# Patient Record
Sex: Male | Born: 1975 | Race: Black or African American | Hispanic: No | Marital: Married | Smoking: Never smoker
Health system: Southern US, Community
[De-identification: ages and names within clinical notes are randomized; demographics above are authoritative.]

## PROBLEM LIST (undated history)

## (undated) DIAGNOSIS — J45909 Unspecified asthma, uncomplicated: Secondary | ICD-10-CM

## (undated) DIAGNOSIS — E669 Obesity, unspecified: Secondary | ICD-10-CM

## (undated) DIAGNOSIS — I10 Essential (primary) hypertension: Secondary | ICD-10-CM

## (undated) HISTORY — PX: TENDON REPAIR: SHX5111

## (undated) HISTORY — DX: Unspecified asthma, uncomplicated: J45.909

## (undated) HISTORY — DX: Essential (primary) hypertension: I10

## (undated) HISTORY — DX: Obesity, unspecified: E66.9

---

## 2013-10-31 ENCOUNTER — Emergency Department: Payer: Self-pay | Admitting: Emergency Medicine

## 2015-01-14 ENCOUNTER — Ambulatory Visit: Payer: Self-pay | Admitting: Family Medicine

## 2015-01-15 ENCOUNTER — Ambulatory Visit: Payer: Self-pay | Admitting: Family Medicine

## 2015-02-04 ENCOUNTER — Ambulatory Visit: Payer: Self-pay | Admitting: Family Medicine

## 2015-02-24 ENCOUNTER — Ambulatory Visit (INDEPENDENT_AMBULATORY_CARE_PROVIDER_SITE_OTHER): Payer: BC Managed Care – PPO | Admitting: Family Medicine

## 2015-02-24 ENCOUNTER — Encounter: Payer: Self-pay | Admitting: Family Medicine

## 2015-02-24 VITALS — BP 127/86 | HR 76 | Temp 97.0°F | Ht 72.0 in | Wt 281.0 lb

## 2015-02-24 DIAGNOSIS — E669 Obesity, unspecified: Secondary | ICD-10-CM

## 2015-02-24 DIAGNOSIS — I83893 Varicose veins of bilateral lower extremities with other complications: Secondary | ICD-10-CM | POA: Diagnosis not present

## 2015-02-24 DIAGNOSIS — I10 Essential (primary) hypertension: Secondary | ICD-10-CM | POA: Diagnosis not present
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
## 2015-02-24 NOTE — Patient Instructions (Addendum)
Try horse chestnut for vascular health We'll refer to vascular specialist Try to use PLAIN allergy medicine without the decongestant Avoid: phenylephrine, phenylpropanolamine, and pseudoephredine If you need something for aches or pains, try to use Tylenol (acetaminphen) instead of non-steroidals (which include Aleve, ibuprofen, Advil, Motrin, and naproxen); non-steroidals can cause long-term kidney damage Your goal blood pressure is less than 140 mmHg on top, bottom number less than 90. Try to follow the DASH guidelines (DASH stands for Dietary Approaches to Stop Hypertension) Try to limit the sodium in your diet.  Ideally, consume less than 1.5 grams (less than 1,500mg ) per day. Do not add salt when cooking or at the table.  Check the sodium amount on labels when shopping, and choose items lower in sodium when given a choice. Avoid or limit foods that already contain a lot of sodium. Eat a diet rich in fruits and vegetables and whole grains. I do recommend yearly flu shots; for individuals who don't want flu shots, try to practice excellent hand hygiene, and avoid nursing homes, day cares, and hospitals during peak flu season; taking additional vitamin C daily during flu/cold season may help boost your immune system too

## 2015-03-02 DIAGNOSIS — I10 Essential (primary) hypertension: Secondary | ICD-10-CM | POA: Insufficient documentation

## 2015-03-02 DIAGNOSIS — E669 Obesity, unspecified: Secondary | ICD-10-CM | POA: Insufficient documentation

## 2015-03-02 DIAGNOSIS — Z6841 Body Mass Index (BMI) 40.0 and over, adult: Secondary | ICD-10-CM | POA: Insufficient documentation

## 2015-03-02 HISTORY — DX: Obesity, unspecified: E66.9

## 2015-03-02 HISTORY — DX: Essential (primary) hypertension: I10

## 2015-03-02 NOTE — Assessment & Plan Note (Signed)
Encouragement given to lose weight 

## 2015-03-02 NOTE — Assessment & Plan Note (Signed)
Refer to vascular specialist; consider horse chestnut which may have some benefit; weight loss encouraged

## 2015-03-02 NOTE — Assessment & Plan Note (Signed)
Would like to see most recent creatinine and electrolytes; continue DASH guidelines; work on weight loss; patient to monitor BP and let me know if not at goal; continue meds

## 2015-05-14 ENCOUNTER — Encounter: Payer: Self-pay | Admitting: Family Medicine

## 2015-05-14 ENCOUNTER — Ambulatory Visit (INDEPENDENT_AMBULATORY_CARE_PROVIDER_SITE_OTHER): Payer: BC Managed Care – PPO | Admitting: Family Medicine

## 2015-05-14 VITALS — BP 119/83 | HR 101 | Temp 98.3°F | Ht 72.3 in | Wt 284.0 lb

## 2015-05-14 DIAGNOSIS — J209 Acute bronchitis, unspecified: Secondary | ICD-10-CM | POA: Diagnosis not present

## 2015-05-14 MED ORDER — BENZONATATE 200 MG PO CAPS: 200.0000 mg | ORAL_CAPSULE | Freq: Two times a day (BID) | ORAL | Status: DC | PRN

## 2015-05-14 MED ORDER — AZITHROMYCIN 250 MG PO TABS: ORAL_TABLET | ORAL | Status: DC

## 2015-05-14 MED ORDER — PREDNISONE 10 MG PO TABS: ORAL_TABLET | ORAL | Status: DC

## 2015-05-14 NOTE — Progress Notes (Signed)
BP 119/83 mmHg  Pulse 101  Temp(Src) 98.3 F (36.8 C)  Ht 6' 0.3" (1.836 m)  Wt 284 lb (128.822 kg)  BMI 38.22 kg/m2  SpO2 95%   Subjective:    Patient ID: Jeffrey Cohen, male    DOB: 1975/04/09, 40 y.o.   MRN: [id number]  HPI: Jeffrey Cohen is a 40 y.o. male  Chief Complaint  Patient presents with  . URI    started last Sunday, has continued to work and take OTC meds   UPPER RESPIRATORY TRACT INFECTION Duration: 1 week Worst symptom: coughing and SOB Fever: no Cough: yes Shortness of breath: yes Wheezing: yes Chest pain: yes, with cough Chest tightness: yes Chest congestion: yes Nasal congestion: no Runny nose: yes Post nasal drip: yes Sneezing: no Sore throat: no Swollen glands: no Sinus pressure: no Headache: yes Face pain: no Toothache: no Ear pain: no  Ear pressure: no  Eyes red/itching:no Eye drainage/crusting: no  Vomiting: no Rash: no Fatigue: yes Sick contacts: yes Strep contacts: no  Context: better Recurrent sinusitis: no Relief with OTC cold/cough medications: yes  Treatments attempted: cold/sinus, mucinex, anti-histamine and pseudoephedrine   Relevant past medical, surgical, family and social history reviewed and updated as indicated. Interim medical history since our last visit reviewed. Allergies and medications reviewed and updated.  Review of Systems  Constitutional: Negative.   HENT: Positive for congestion, postnasal drip, rhinorrhea, sneezing and sore throat. Negative for dental problem, drooling, ear discharge, ear pain, facial swelling, hearing loss, mouth sores, nosebleeds, sinus pressure, tinnitus, trouble swallowing and voice change.   Respiratory: Positive for cough, chest tightness, shortness of breath and wheezing. Negative for apnea, choking and stridor.   Cardiovascular: Negative.   Psychiatric/Behavioral: Negative.     Per HPI unless specifically indicated above     Objective:    BP 119/83 mmHg  Pulse 101  Temp(Src)  98.3 F (36.8 C)  Ht 6' 0.3" (1.836 m)  Wt 284 lb (128.822 kg)  BMI 38.22 kg/m2  SpO2 95%  Wt Readings from Last 3 Encounters:  05/14/15 284 lb (128.822 kg)  02/24/15 281 lb (127.461 kg)    Physical Exam  Constitutional: He is oriented to person, place, and time. He appears well-developed and well-nourished. No distress.  HENT:  Head: Normocephalic and atraumatic.  Right Ear: Hearing, tympanic membrane, external ear and ear canal normal.  Left Ear: Hearing, tympanic membrane, external ear and ear canal normal.  Nose: Mucosal edema and rhinorrhea present.  Mouth/Throat: Uvula is midline, oropharynx is clear and moist and mucous membranes are normal. No oropharyngeal exudate.  Eyes: Conjunctivae, EOM and lids are normal. Pupils are equal, round, and reactive to light. Right eye exhibits no discharge. Left eye exhibits no discharge. No scleral icterus.  Neck: Normal range of motion. Neck supple. No JVD present. No tracheal deviation present. No thyromegaly present.  Cardiovascular: Normal rate, regular rhythm, normal heart sounds and intact distal pulses.  Exam reveals no gallop and no friction rub.   No murmur heard. Pulmonary/Chest: Effort normal. No stridor. No respiratory distress. He has decreased breath sounds in the right upper field, the right middle field, the left upper field, the left middle field and the left lower field. He has wheezes in the right upper field, the right middle field, the left upper field and the left middle field. He has no rales. He exhibits no tenderness.  Musculoskeletal: Normal range of motion.  Lymphadenopathy:    He has cervical adenopathy.  Neurological: He is alert  and oriented to person, place, and time.  Skin: Skin is warm, dry and intact. No rash noted. No erythema. No pallor.  Psychiatric: He has a normal mood and affect. His speech is normal and behavior is normal. Judgment and thought content normal. Cognition and memory are normal.  Nursing note  and vitals reviewed.   No results found for this or any previous visit.    Assessment & Plan:   Problem List Items Addressed This Visit    None    Visit Diagnoses    Acute bronchitis, unspecified organism    -  Primary    Will treat with prednisone, azithromycin and tessalon. Continue to mointor. Call with any concerns.         Follow up plan: Return if symptoms worsen or fail to improve.

## 2015-05-17 ENCOUNTER — Telehealth: Payer: Self-pay

## 2015-05-17 ENCOUNTER — Encounter: Payer: Self-pay | Admitting: Family Medicine

## 2015-05-17 NOTE — Telephone Encounter (Signed)
Rx written, OK for them to come pick up.

## 2015-05-17 NOTE — Telephone Encounter (Signed)
Patient wife called, patient is still sick. Is there anyway that he can have a work note for today and tomorrow.

## 2015-05-19 ENCOUNTER — Telehealth: Payer: Self-pay | Admitting: Family Medicine

## 2015-05-19 NOTE — Telephone Encounter (Signed)
I spoke with patient's wife; he is getting better, but still has issues with getting winded; hacking up mucous; did have a fever the first few days on the antibiotics; the breathing is the real issue; he is supposed to go back to work on Saturday; I offered xray, she doesn't think he needs it today; offered appt; she'll bring him tomorrow with her Jeffrey Cohen -- please book patient for Thursday March 2nd at 9:45 Move wife's appt from 9:30 am to 8:30 am

## 2015-05-19 NOTE — Telephone Encounter (Signed)
Routing to provider  

## 2015-05-19 NOTE — Telephone Encounter (Signed)
Pt's wife called stated pt is still not feeling better even after completing antibiotics. Pt is getting winded even just walking up the steps. Can Dr. Sherie Don please call pt's wife she is very concerned about the pt. Please call ASAP. Pt returns to work on Saturday. Thanks.

## 2015-05-20 ENCOUNTER — Encounter: Payer: Self-pay | Admitting: Family Medicine

## 2015-05-20 ENCOUNTER — Ambulatory Visit (INDEPENDENT_AMBULATORY_CARE_PROVIDER_SITE_OTHER): Payer: BC Managed Care – PPO | Admitting: Family Medicine

## 2015-05-20 VITALS — BP 144/90 | HR 72 | Temp 97.8°F | Wt 278.0 lb

## 2015-05-20 DIAGNOSIS — J069 Acute upper respiratory infection, unspecified: Secondary | ICD-10-CM | POA: Diagnosis not present

## 2015-05-20 DIAGNOSIS — R062 Wheezing: Secondary | ICD-10-CM

## 2015-05-20 DIAGNOSIS — B9789 Other viral agents as the cause of diseases classified elsewhere: Principal | ICD-10-CM

## 2015-05-20 DIAGNOSIS — I10 Essential (primary) hypertension: Secondary | ICD-10-CM

## 2015-05-20 MED ORDER — HYDROCOD POLST-CPM POLST ER 10-8 MG/5ML PO SUER: 5.0000 mL | Freq: Two times a day (BID) | ORAL | Status: DC | PRN

## 2015-05-20 MED ORDER — ALBUTEROL SULFATE HFA 108 (90 BASE) MCG/ACT IN AERS: 2.0000 | INHALATION_SPRAY | Freq: Four times a day (QID) | RESPIRATORY_TRACT | Status: DC | PRN

## 2015-05-20 NOTE — Patient Instructions (Addendum)
Work note up front Please do eat yogurt daily or take a probiotic daily for the next month or two We want to replace the healthy germs in the gut If you notice foul, watery diarrhea in the next two months, schedule an appointment RIGHT AWAY Use the inhaler as needed Use the new cough medicine as needed Try vitamin C (orange juice if not diabetic or vitamin C tablets) and drink green tea to help your immune system during your illness Get plenty of rest and hydration Return in several weeks for a pneumonia vaccine (PPSV-23) Keep working on weight loss and DASH guidelines

## 2015-05-20 NOTE — Progress Notes (Signed)
BP 144/90 mmHg  Pulse 72  Temp(Src) 97.8 F (36.6 C)  Wt 278 lb (126.1 kg)  SpO2 97%   Subjective:    Patient ID: Jeffrey Cohen, male    DOB: Aug 13, 1975, 40 y.o.   MRN: [id number]  HPI: Jeffrey Cohen is a 40 y.o. male  Chief Complaint  Patient presents with  . Winded    He is doing betetr but still gets a little winded, hacking up alot of mucous   He was here for a sick visit Fever off and on for three days He had pneumonia as a child and an adult; he just keeps going Winded with exertion; no SHOB at rest Sputum was brown and clear today except little bit of yellow Has had diarrhea for 2-3 days, not eating well Tessalon works okay No issues with prednisone Issues with the antibiotics; no rash; stomach made weird sounds  Patient does not do flu shots  BP away from here, 130s systolic; paitent content with current readings; trying to do DASH guidelines, working on weight loss; does not want more medicine  Relevant past medical history reviewed  Interim medical history since our last visit reviewed. Allergies and medications reviewed and updated.  Review of Systems Per HPI unless specifically indicated above     Objective:    BP 144/90 mmHg  Pulse 72  Temp(Src) 97.8 F (36.6 C)  Wt 278 lb (126.1 kg)  SpO2 97%  Wt Readings from Last 3 Encounters:  05/20/15 278 lb (126.1 kg)  05/14/15 284 lb (128.822 kg)  02/24/15 281 lb (127.461 kg)    Physical Exam  Constitutional: He appears well-developed and well-nourished. No distress.  Weight loss 6 pounds over last week  Eyes: Right eye exhibits no discharge. Left eye exhibits no discharge. No scleral icterus.  Neck: No JVD present.  Cardiovascular: Normal rate and regular rhythm.   Pulmonary/Chest: Effort normal. He has wheezes (very faint expiratory wheezes). He has no rales.  Abdominal: He exhibits no distension.  Musculoskeletal: He exhibits no edema.  Lymphadenopathy:    He has no cervical adenopathy.  Skin:  Skin is warm.  Psychiatric: He has a normal mood and affect.      Assessment & Plan:   Problem List Items Addressed This Visit      Cardiovascular and Mediastinum   Essential hypertension, benign    Patient does not want more medicine; he'll continue to work on diet, weight loss       Other Visit Diagnoses    Viral upper respiratory tract infection with cough    -  Primary    rest, hydration, cough syrup; reasons to call reviewed; out of work a few more days    Wheezing        Rx for SABA given; use if needed       Follow up plan: No Follow-up on file. PRN  Meds ordered this encounter  Medications  . albuterol (PROVENTIL HFA) 108 (90 Base) MCG/ACT inhaler    Sig: Inhale 2 puffs into the lungs every 6 (six) hours as needed for wheezing or shortness of breath.    Dispense:  1 Inhaler    Refill:  1  . chlorpheniramine-HYDROcodone (TUSSIONEX PENNKINETIC ER) 10-8 MG/5ML SUER    Sig: Take 5 mLs by mouth every 12 (twelve) hours as needed for cough.    Dispense:  115 mL    Refill:  0   An after-visit summary was printed and given to the patient at check-out.  Please see the patient instructions which may contain other information and recommendations beyond what is mentioned above in the assessment and plan.

## 2015-05-25 NOTE — Assessment & Plan Note (Signed)
Patient does not want more medicine; he'll continue to work on diet, weight loss

## 2015-08-02 ENCOUNTER — Telehealth: Payer: Self-pay | Admitting: Family Medicine

## 2015-08-02 NOTE — Telephone Encounter (Signed)
Pts wife called and stated that her little girl had been diagnosed with hand foot mouth and now the pt has developed a fever, cold sores , his tongue and neck are swollen and his throat is sore. The pt has never had chicken pox and she fears that he has gotten it from the baby. I advised her to take the pt to urgent care. She stated that she would try to get him to go but also scheduled an appt to come in 08/04/2015.

## 2015-08-04 ENCOUNTER — Ambulatory Visit (INDEPENDENT_AMBULATORY_CARE_PROVIDER_SITE_OTHER): Payer: BC Managed Care – PPO | Admitting: Family Medicine

## 2015-08-04 ENCOUNTER — Encounter: Payer: Self-pay | Admitting: Family Medicine

## 2015-08-04 VITALS — BP 129/85 | HR 86 | Temp 98.0°F | Ht 72.2 in | Wt 275.0 lb

## 2015-08-04 DIAGNOSIS — B084 Enteroviral vesicular stomatitis with exanthem: Secondary | ICD-10-CM | POA: Diagnosis not present

## 2015-08-04 NOTE — Progress Notes (Signed)
   BP 129/85 mmHg  Pulse 86  Temp(Src) 98 F (36.7 C)  Ht 6' 0.2" (1.834 m)  Wt 275 lb (124.739 kg)  BMI 37.09 kg/m2  SpO2 96%   Subjective:    Patient ID: Jeffrey Cohen, male    DOB: 1975/08/24, 40 y.o.   MRN: [id number]  HPI: Jeffrey Cohen is a 40 y.o. male  Chief Complaint  Patient presents with  . cold sores, swollen tounge, fatigue, fever    daughter dx Hand Foot Mouth  Patient with several days of fever up to 102 sore throat and mouth developing mouth ulcers and generalized malaise and feeling bad. No nausea vomiting, no known tick exposure, no myalgias. No rash on hands and feet. Patient's daughter was diagnosed last week with hand-foot-and-mouth disease. Is recovering okay but just feels bad.  Relevant past medical, surgical, family and social history reviewed and updated as indicated. Interim medical history since our last visit reviewed. Allergies and medications reviewed and updated.  Review of Systems  Constitutional: Positive for fever, chills, diaphoresis and fatigue.  HENT: Positive for mouth sores, nosebleeds, postnasal drip, sore throat, trouble swallowing and voice change. Negative for congestion, rhinorrhea, sinus pressure and sneezing.   Eyes: Negative.   Respiratory: Negative.   Cardiovascular: Negative.   Gastrointestinal: Negative.     Per HPI unless specifically indicated above     Objective:    BP 129/85 mmHg  Pulse 86  Temp(Src) 98 F (36.7 C)  Ht 6' 0.2" (1.834 m)  Wt 275 lb (124.739 kg)  BMI 37.09 kg/m2  SpO2 96%  Wt Readings from Last 3 Encounters:  08/04/15 275 lb (124.739 kg)  05/20/15 278 lb (126.1 kg)  05/14/15 284 lb (128.822 kg)    Physical Exam  Constitutional: He is oriented to person, place, and time. He appears well-developed and well-nourished. No distress.  HENT:  Head: Normocephalic and atraumatic.  Right Ear: Hearing and external ear normal.  Left Ear: Hearing and external ear normal.  Nose: Nose normal.  Mouth  inflamed with multiple ulcers, tongue inflamed with geographic changes pharynx inflamed  Eyes: Conjunctivae and lids are normal. Right eye exhibits no discharge. Left eye exhibits no discharge. No scleral icterus.  Neck: No thyromegaly present.  Cardiovascular: Normal rate, regular rhythm and normal heart sounds.   Pulmonary/Chest: Effort normal and breath sounds normal. No respiratory distress.  Musculoskeletal: Normal range of motion.  Lymphadenopathy:    He has no cervical adenopathy.  Neurological: He is alert and oriented to person, place, and time.  Skin: Skin is intact. No rash noted.  Psychiatric: He has a normal mood and affect. His speech is normal and behavior is normal. Judgment and thought content normal. Cognition and memory are normal.    No results found for this or any previous visit.    Assessment & Plan:   Problem List Items Addressed This Visit    None    Visit Diagnoses    Hand, foot and mouth disease    -  Primary    Discuss unusual nature of adult with viral condition will treat with Tylenol fluids rest discussed possibility of unusual but severe complications and will rech        Follow up plan: Return if symptoms worsen or fail to improve, for As scheduled.

## 2015-08-05 ENCOUNTER — Ambulatory Visit
Admission: EM | Admit: 2015-08-05 | Discharge: 2015-08-05 | Disposition: A | Discharge status: Home / Self Care | Payer: BC Managed Care – PPO | Attending: Emergency Medicine | Admitting: Emergency Medicine

## 2015-08-05 ENCOUNTER — Encounter: Payer: Self-pay | Admitting: *Deleted

## 2015-08-05 ENCOUNTER — Telehealth: Payer: Self-pay | Admitting: Family Medicine

## 2015-08-05 DIAGNOSIS — B9789 Other viral agents as the cause of diseases classified elsewhere: Secondary | ICD-10-CM

## 2015-08-05 DIAGNOSIS — B084 Enteroviral vesicular stomatitis with exanthem: Secondary | ICD-10-CM | POA: Diagnosis not present

## 2015-08-05 DIAGNOSIS — K121 Other forms of stomatitis: Secondary | ICD-10-CM

## 2015-08-05 MED ORDER — FAMCICLOVIR 500 MG PO TABS: 1500.0000 mg | ORAL_TABLET | Freq: Once | ORAL | Status: DC

## 2015-08-05 MED ORDER — LIDOCAINE VISCOUS 2 % MT SOLN: 15.0000 mL | Freq: Four times a day (QID) | OROMUCOSAL | Status: DC | PRN

## 2015-08-05 MED ORDER — NAPROXEN 500 MG PO TABS: 500.0000 mg | ORAL_TABLET | Freq: Two times a day (BID) | ORAL | Status: DC

## 2015-08-05 NOTE — Telephone Encounter (Signed)
Patient wife called stating that he can't talk and wants to know if Dr. Dossie Arbourrissman call him in something. Jeffrey Cohen Geraldung is swollen. If he can have Lanacane.

## 2015-08-05 NOTE — Telephone Encounter (Signed)
Phone call

## 2015-08-05 NOTE — ED Notes (Addendum)
Pt states that he has mouth sores, swollen tongue and been running fever since Saturday.  Pt states that his child has recently had Hand Mouth & Foot. Pt was seen yesterday by PCP, was dx with hand mouth and foot disease, but was not given any medication to help with mouth sores.

## 2015-08-05 NOTE — ED Provider Notes (Signed)
CSN: [id number]     Arrival date & time 08/05/15  1827 History   First MD Initiated Contact with Patient 08/05/15 1855     Chief Complaint  Patient presents with  . Oral Swelling   (Consider location/radiation/quality/duration/timing/severity/associated sxs/prior Treatment) HPI   This is a 40 year old male presents with a swollen tongue mouth sores and difficulty eating or drinking. Daughter was diagnosed with hand-foot-and-mouth disease last week he saw his PCP today who stated that had also contracted it did not give him any medications for his stomatitis.    Past Medical History  Diagnosis Date  . Asthma    Past Surgical History  Procedure Laterality Date  . Tendon repair      left thumb   Family History  Problem Relation Age of Onset  . Cancer Mother     liver  . Emphysema Mother   . Hypertension Mother   . Diabetes Maternal Grandfather   . Heart disease Neg Hx   . COPD Neg Hx   . Stroke Neg Hx    Social History  Substance Use Topics  . Smoking status: Never Smoker   . Smokeless tobacco: Never Used  . Alcohol Use: Yes     Comment: occasional    Review of Systems  Constitutional: Positive for fever, activity change and appetite change. Negative for chills and fatigue.  HENT: Positive for trouble swallowing.   All other systems reviewed and are negative.   Allergies  Review of patient's allergies indicates no known allergies.  Home Medications   Prior to Admission medications   Medication Sig Start Date End Date Taking? Authorizing Provider  albuterol (PROVENTIL HFA) 108 (90 Base) MCG/ACT inhaler Inhale 2 puffs into the lungs every 6 (six) hours as needed for wheezing or shortness of breath. 05/20/15   Kerman Passey, MD  famciclovir (FAMVIR) 500 MG tablet Take 3 tablets (1,500 mg total) by mouth once. 08/05/15   Lutricia Feil, PA-C  lidocaine (XYLOCAINE) 2 % solution Use as directed 15 mLs in the mouth or throat every 6 (six) hours as needed for mouth pain.  08/05/15   Lutricia Feil, PA-C  losartan-hydrochlorothiazide (HYZAAR) 50-12.5 MG tablet Take 1 tablet by mouth daily.     Historical Provider, MD  naproxen (NAPROSYN) 500 MG tablet Take 1 tablet (500 mg total) by mouth 2 (two) times daily with a meal. 08/05/15   Lutricia Feil, PA-C   Meds Ordered and Administered this Visit  Medications - No data to display  BP 130/91 mmHg  Pulse 87  Temp(Src) 98.2 F (36.8 C) (Oral)  Ht  (1.88 m)  Wt 275 lb (124.739 kg)  BMI 35.29 kg/m2  SpO2 99% No data found.   Physical Exam  Constitutional: He is oriented to person, place, and time. He appears well-developed and well-nourished. No distress.  HENT:  Head: Normocephalic and atraumatic.  Right Ear: External ear normal.  Left Ear: External ear normal.  Exemption of the patient's mouth shows several ulcerations on the tongue daily the tip and underneath the tongue. He has a few aphthous type ulcers on his cheeks. And a cold sore on his lower lip on the right. Is no cervical adenopathy appreciated.  Musculoskeletal: Normal range of motion. He exhibits no edema or tenderness.  Neurological: He is alert and oriented to person, place, and time.  Skin: Skin is warm and dry. He is not diaphoretic.  Psychiatric: He has a normal mood and affect. His behavior is normal.  Judgment and thought content normal.  Nursing note and vitals reviewed.   ED Course  Procedures (including critical care time)  Labs Review Labs Reviewed - No data to display  Imaging Review No results found.   Visual Acuity Review  Right Eye Distance:   Left Eye Distance:   Bilateral Distance:    Right Eye Near:   Left Eye Near:    Bilateral Near:         MDM   1. Hand, foot and mouth disease   2. Stomatitis, viral    Discharge Medication List as of 08/05/2015  7:25 PM    START taking these medications   Details  famciclovir (FAMVIR) 500 MG tablet Take 3 tablets (1,500 mg total) by mouth once., Starting  08/05/2015, Normal    lidocaine (XYLOCAINE) 2 % solution Use as directed 15 mLs in the mouth or throat every 6 (six) hours as needed for mouth pain., Starting 08/05/2015, Until Discontinued, Normal    naproxen (NAPROSYN) 500 MG tablet Take 1 tablet (500 mg total) by mouth 2 (two) times daily with a meal., Starting 08/05/2015, Until Discontinued, Normal      Plan: 1. Test/x-ray results and diagnosis reviewed with patient 2. rx as per orders; risks, benefits, potential side effects reviewed with patient 3. Recommend supportive treatment with Increase fluids and I have advised him to consider using a straw was unable to drink regularly. I will place him on Famvir for 1 dose for the cold sore and provide him with lidocaine viscus to dab on the areas of pain is able to tolerate food or and fluids. So recommended stopping the Tylenol that he is currently using the switching to Naprosyn for anti-inflammatory effects. He continues to have problems he should return to his primary care physician. I told him this can take up to 10 days to fully resolve. 4. F/u prn if symptoms worsen or don't improve     Lutricia FeilWilliam P Ivelise Castillo, PA-C 08/05/15 1935  Lutricia FeilWilliam P Everard Interrante, [place]PA-C 08/05/15 2011

## 2015-08-05 NOTE — Telephone Encounter (Signed)
Routing to Dr.Crissman 

## 2015-08-05 NOTE — Telephone Encounter (Signed)
Call wife  Patient wife called stating that he can't talk and wants to know if Dr. Dossie Arbourrissman call him in something. Jeffrey Cohen is swollen. If he can have Lanacane.

## 2015-08-05 NOTE — Discharge Instructions (Signed)
Stomatitis  Stomatitis is a condition that causes inflammation in your mouth. It can affect a part of your mouth or your whole mouth. The condition often affects your cheek, teeth, gums, lips, and tongue. Stomatitis can also affect the mucous membranes that surround your mouth (mucosa).  Pain from stomatitis can make it hard for you to eat or drink. Severe cases of this condition can lead to dehydration or poor nutrition.  CAUSES  Common causes of this condition include:  · Viruses, such as cold sores or oral herpes and shingles.  · Canker sores.  · Bacterial infections.  · Fungus or yeast infections, such as oral thrush.  · Not getting adequate nutrition.  · Injury to your mouth. This can be from:    Dentures or braces that do not fit well.    Biting your tongue or cheek.    Burning your mouth.    Having sharp or broken teeth.  · Gum disease.  · Using tobacco, especially chewing tobacco.  · Allergies to foods, medicines, or substances that are used in your mouth.  · Medicines, including cancer medicines (chemotherapy), antihistamines, and seizure medicines.  In some cases, the cause may not be known.  RISK FACTORS  This condition is more likely to develop in people who:  · Have poor oral hygiene or poor nutrition.  · Have any condition that causes a dry mouth.  · Are under a lot of physical or emotional stress.  · Have any condition that weakens the body's defense system (immune system).  · Are being treated for cancer.  · Smoke.  SYMPTOMS  The most common symptoms of this condition are pain, swelling, and redness inside your mouth. The pain may feel like burning or stinging. It may get worse from eating or drinking. Other symptoms include:  · Painful, shallow sores (ulcers) in the mouth.  · Blisters in the mouth.  · Bleeding gums.  · Swollen gums.  · Irritability and fatigue.  · Bad breath.  · Bad taste in the mouth.  · Fever.  DIAGNOSIS  This condition is diagnosed with a physical exam to check for bleeding gums  and mouth ulcers. You may also have other tests, including:  · Blood tests to look for infection or vitamin deficiencies.  · Mouth swab to get a fluid sample to test for bacteria (culture).  · Tissue sample from an ulcer to examine under a microscope (biopsy).  TREATMENT  Treatment for stomatitis depends on the cause. Treatment may include medicines, such as:  · Over-the counter (OTC) pain medicines.  · Topical anesthetic to numb the area if you have severe pain.  · Antibiotics to treat a bacterial infection.  · Antifungals to treat a fungal infection.  · Antivirals to treat a viral infection.  · Mouth rinses that contain steroids to reduce the swelling in your mouth.  · Other medicines to coat or numb your mouth.  HOME CARE INSTRUCTIONS  Medicines  · Take medicines only as directed by your health care provider.  · If you were prescribed an antibiotic, finish all of it even if you start to feel better.  Lifestyle  · Practice good oral hygiene:    Gently brush your teeth with a soft, nylon-bristled toothbrush two times each day.    Floss your teeth every day.    Have your teeth cleaned regularly, as recommended by your dentist.  · Eat a balanced diet. Do not eat:    Spicy foods.      Citrus, such as oranges.    Foods that have sharp edges, such as chips.  · Avoid any foods or other allergens that you think may be causing your stomatitis.  · If you have dentures, make sure that they are properly fitted.  · Do not use any tobacco products, including cigarettes, chewing tobacco, or electronic cigarettes. If you need help quitting, ask your health care provider.  · Find ways to reduce stress. Try yoga or meditation. Ask your health care provider for other ideas.  General Instructions  · Use a salt-water rinse for pain as directed by your health care provider. Mix 1 tsp of salt in 2 cups of water.  · Drink enough fluid to keep your urine clear or pale yellow. This will keep you hydrated.  SEEK MEDICAL CARE IF:  · Your  symptoms get worse.  · You develop new symptoms, especially:    A rash.    New symptoms that do not involve your mouth area.  · Your symptoms last longer than three weeks.  · Your stomatitis goes away and then returns.  · You have a harder time eating and drinking normally.  · You have increasing fatigue or weakness.  · You lose your appetite or you feel nauseous.  · You have a fever.     This information is not intended to replace advice given to you by your health care provider. Make sure you discuss any questions you have with your health care provider.     Document Released: 01/01/2007 Document Revised: 07/21/2014 Document Reviewed: 03/02/2014  Elsevier Interactive Patient Education ©2016 [employer]

## 2015-10-29 HISTORY — PX: VEIN LIGATION AND STRIPPING: SHX2653

## 2015-12-24 ENCOUNTER — Ambulatory Visit (INDEPENDENT_AMBULATORY_CARE_PROVIDER_SITE_OTHER): Payer: BC Managed Care – PPO | Admitting: Vascular Surgery

## 2015-12-24 ENCOUNTER — Encounter (INDEPENDENT_AMBULATORY_CARE_PROVIDER_SITE_OTHER): Payer: Self-pay | Admitting: Vascular Surgery

## 2015-12-24 VITALS — BP 153/93 | HR 83 | Resp 17 | Ht 74.0 in | Wt 283.0 lb

## 2015-12-24 DIAGNOSIS — M79605 Pain in left leg: Secondary | ICD-10-CM

## 2015-12-24 DIAGNOSIS — I83893 Varicose veins of bilateral lower extremities with other complications: Secondary | ICD-10-CM | POA: Diagnosis not present

## 2015-12-24 DIAGNOSIS — M79604 Pain in right leg: Secondary | ICD-10-CM

## 2015-12-24 DIAGNOSIS — M79609 Pain in unspecified limb: Secondary | ICD-10-CM | POA: Insufficient documentation

## 2015-12-24 DIAGNOSIS — I10 Essential (primary) hypertension: Secondary | ICD-10-CM | POA: Diagnosis not present

## 2015-12-24 NOTE — Assessment & Plan Note (Signed)
The patient has done well with this treatment for his venous insufficiency. He is undergone bilateral endovenous laser ablation with good results. He has noticed improvement in his symptoms although he still has some swelling and discoloration. Recommend he continue wear compression stockings and elevate his legs as needed. We will see him back on an as-needed basis.

## 2015-12-24 NOTE — Assessment & Plan Note (Signed)
blood pressure control important in reducing the progression of atherosclerotic disease. On appropriate oral medications.  

## 2015-12-24 NOTE — Progress Notes (Signed)
MRN : [id number]  Jeffrey DibblesWilliam Cohen is a 40 y.o. (11/25/1975) male who presents with chief complaint of  Chief Complaint  Patient presents with  . Follow-up    post laser  .  History of Present Illness: Patient returns today in follow up of His venous disease. He continues to do well after laser ablation on both lower extremities. He has noticed improvement in pain and swelling in both lower extremities although some discoloration and swelling are still present. He had successful ablations without DVT bilaterally. He had some mild bleeding from his great saphenous vein access site on the right, but this has long since resolved.  Current Outpatient Prescriptions  Medication Sig Dispense Refill  . albuterol (PROVENTIL HFA) 108 (90 Base) MCG/ACT inhaler Inhale 2 puffs into the lungs every 6 (six) hours as needed for wheezing or shortness of breath. 1 Inhaler 1  . losartan-hydrochlorothiazide (HYZAAR) 50-12.5 MG tablet Take 1 tablet by mouth daily.     . naproxen (NAPROSYN) 500 MG tablet Take 1 tablet (500 mg total) by mouth 2 (two) times daily with a meal. 60 tablet 0  . famciclovir (FAMVIR) 500 MG tablet Take 3 tablets (1,500 mg total) by mouth once. (Patient not taking: Reported on 12/24/2015) 3 tablet 0  . lidocaine (XYLOCAINE) 2 % solution Use as directed 15 mLs in the mouth or throat every 6 (six) hours as needed for mouth pain. (Patient not taking: Reported on 12/24/2015) 100 mL 0   No current facility-administered medications for this visit.     Past Medical History:  Diagnosis Date  . Asthma     Past Surgical History:  Procedure Laterality Date  . TENDON REPAIR     left thumb    Social History Social History  Substance Use Topics  . Smoking status: Never Smoker  . Smokeless tobacco: Never Used  . Alcohol use Yes     Comment: occasional    Family History Family History  Problem Relation Age of Onset  . Cancer Mother     liver  . Emphysema Mother   . Hypertension  Mother   . Diabetes Maternal Grandfather   . Heart disease Neg Hx   . COPD Neg Hx   . Stroke Neg Hx     No Known Allergies   REVIEW OF SYSTEMS (Negative unless checked)  Constitutional: [] Weight loss  [] Fever  [] Chills Cardiac: [] Chest pain   [] Chest pressure   [] Palpitations   [] Shortness of breath when laying flat   [] Shortness of breath at rest   [] Shortness of breath with exertion. Vascular:  [] Pain in legs with walking   [] Pain in legs at rest   [] Pain in legs when laying flat   [] Claudication   [] Pain in feet when walking  [] Pain in feet at rest  [] Pain in feet when laying flat   [] History of DVT   [] Phlebitis   [x] Swelling in legs   [x] Varicose veins   [] Non-healing ulcers Pulmonary:   [] Uses home oxygen   [] Productive cough   [] Hemoptysis   [] Wheeze  [] COPD   [] Asthma Neurologic:  [] Dizziness  [] Blackouts   [] Seizures   [] History of stroke   [] History of TIA  [] Aphasia   [] Temporary blindness   [] Dysphagia   [] Weakness or numbness in arms   [] Weakness or numbness in legs Musculoskeletal:  [] Arthritis   [] Joint swelling   [] Joint pain   [] Low back pain Hematologic:  [] Easy bruising  [] Easy bleeding   [] Hypercoagulable state   [] Anemic  Gastrointestinal:  [] Blood in stool   [] Vomiting blood  [] Gastroesophageal reflux/heartburn   [] Abdominal pain Genitourinary:  [] Chronic kidney disease   [] Difficult urination  [] Frequent urination  [] Burning with urination   [] Hematuria Skin:  [x] Rashes   [] Ulcers   [] Wounds Psychological:  [] History of anxiety   []  History of major depression.  Physical Examination  BP (!) 153/93   Pulse 83   Resp 17   Ht 6\' 2"  (1.88 m)   Wt 283 lb (128.4 kg)   BMI 36.34 kg/m  Gen:  WD/WN, NAD Head: [place]/AT, No temporalis wasting. Ear/Nose/Throat: Hearing grossly intact, nares w/o erythema or drainage, trachea midline Eyes: PERRLA, EOMI. Sclera non-icteric Neck: Supple, no nuchal rigidity.  No JVD.  Pulmonary:  Good air movement, no use of accessory  muscles.  Cardiac: RRR, normal S1, S2 Vascular:  Vessel Right Left  Radial Palpable Palpable  Ulnar Palpable Palpable  Brachial Palpable Palpable  Carotid Palpable, without bruit Palpable, without bruit  Aorta Not palpable N/A  Femoral Palpable Palpable  Popliteal Palpable Palpable  PT Palpable Palpable  DP Palpable Palpable   Gastrointestinal: soft, non-tender/non-distended. No guarding/reflex.  Musculoskeletal: M/S 5/5 throughout.  No deformity or atrophy. 1+ lower extremity bilateral edema. Neurologic: CN 2-12 intact. Pain and light touch intact in extremities.  Symmetrical.  Speech is fluent.  Psychiatric: Judgment intact, Mood & affect appropriate for pt's clinical situation. Dermatologic: No rashes or ulcers noted.  No cellulitis or open wounds. Moderate stasis dermatitis changes are present on both lower extremities Lymph : No Cervical, Axillary, or Inguinal lymphadenopathy.      Labs No results found for this or any previous visit (from the past 2160 hour(s)).  Radiology No results found.    Assessment/Plan  Essential hypertension, benign blood pressure control important in reducing the progression of atherosclerotic disease. On appropriate oral medications.   Varicose veins of both lower extremities with complications The patient has done well with this treatment for his venous insufficiency. He is undergone bilateral endovenous laser ablation with good results. He has noticed improvement in his symptoms although he still has some swelling and discoloration. Recommend he continue wear compression stockings and elevate his legs as needed. We will see him back on an as-needed basis.  Pain in limb Better after treatment of his venous disease.    Festus Barren, MD  12/24/2015 1:49 PM    This note was created with Dragon medical transcription system.  Any errors from dictation are purely unintentional

## 2015-12-24 NOTE — Assessment & Plan Note (Signed)
Better after treatment of his venous disease.

## 2016-02-23 ENCOUNTER — Ambulatory Visit: Payer: BC Managed Care – PPO | Admitting: Family Medicine

## 2016-04-14 ENCOUNTER — Ambulatory Visit: Payer: BC Managed Care – PPO | Admitting: Family Medicine

## 2016-04-14 ENCOUNTER — Encounter: Payer: Self-pay | Admitting: Family Medicine

## 2016-04-14 ENCOUNTER — Other Ambulatory Visit: Payer: Self-pay

## 2016-04-14 ENCOUNTER — Ambulatory Visit (INDEPENDENT_AMBULATORY_CARE_PROVIDER_SITE_OTHER): Payer: BC Managed Care – PPO | Admitting: Family Medicine

## 2016-04-14 VITALS — BP 128/94 | HR 88 | Temp 97.9°F | Resp 14 | Wt 280.3 lb

## 2016-04-14 DIAGNOSIS — E6609 Other obesity due to excess calories: Secondary | ICD-10-CM

## 2016-04-14 DIAGNOSIS — Z0000 Encounter for general adult medical examination without abnormal findings: Secondary | ICD-10-CM | POA: Diagnosis not present

## 2016-04-14 DIAGNOSIS — I10 Essential (primary) hypertension: Secondary | ICD-10-CM

## 2016-04-14 DIAGNOSIS — L83 Acanthosis nigricans: Secondary | ICD-10-CM

## 2016-04-14 DIAGNOSIS — L918 Other hypertrophic disorders of the skin: Secondary | ICD-10-CM

## 2016-04-14 DIAGNOSIS — Z6835 Body mass index (BMI) 35.0-35.9, adult: Secondary | ICD-10-CM

## 2016-04-14 DIAGNOSIS — L309 Dermatitis, unspecified: Secondary | ICD-10-CM | POA: Insufficient documentation

## 2016-04-14 DIAGNOSIS — L308 Other specified dermatitis: Secondary | ICD-10-CM

## 2016-04-14 LAB — CBC WITH DIFFERENTIAL/PLATELET
Basophils Absolute: 0 cells/uL (ref 0–200)
Basophils Relative: 0 %
EOS PCT: 5 %
Eosinophils Absolute: 275 cells/uL (ref 15–500)
HCT: 46.9 % (ref 38.5–50.0)
HEMOGLOBIN: 15.5 g/dL (ref 13.2–17.1)
LYMPHS ABS: 1925 {cells}/uL (ref 850–3900)
Lymphocytes Relative: 35 %
MCH: 28.1 pg (ref 27.0–33.0)
MCHC: 33 g/dL (ref 32.0–36.0)
MCV: 85.1 fL (ref 80.0–100.0)
MONO ABS: 440 {cells}/uL (ref 200–950)
MPV: 9.7 fL (ref 7.5–12.5)
Monocytes Relative: 8 %
NEUTROS ABS: 2860 {cells}/uL (ref 1500–7800)
Neutrophils Relative %: 52 %
Platelets: 281 10*3/uL (ref 140–400)
RBC: 5.51 MIL/uL (ref 4.20–5.80)
RDW: 14.1 % (ref 11.0–15.0)
WBC: 5.5 10*3/uL (ref 3.8–10.8)

## 2016-04-14 LAB — TSH: TSH: 0.75 m[IU]/L (ref 0.40–4.50)

## 2016-04-14 MED ORDER — TRIAMCINOLONE ACETONIDE 0.5 % EX OINT: 1.0000 "application " | TOPICAL_OINTMENT | Freq: Two times a day (BID) | CUTANEOUS | 0 refills | Status: DC

## 2016-04-14 MED ORDER — LOSARTAN POTASSIUM-HCTZ 50-12.5 MG PO TABS: 1.0000 | ORAL_TABLET | Freq: Every day | ORAL | 3 refills | Status: DC

## 2016-04-14 NOTE — Assessment & Plan Note (Signed)
Check labs only

## 2016-04-14 NOTE — Patient Instructions (Signed)
Check blood pressure at home Your goal blood pressure is less than 140 mmHg on top, and under 90 on the bottom Try to follow the DASH guidelines (DASH stands for Dietary Approaches to Stop Hypertension) Try to limit the sodium in your diet.  Ideally, consume less than 1.5 grams (less than 1,500mg ) per day. Do not add salt when cooking or at the table.  Check the sodium amount on labels when shopping, and choose items lower in sodium when given a choice. Avoid or limit foods that already contain a lot of sodium. Eat a diet rich in fruits and vegetables and whole grains.  Check out the information at familydoctor.org entitled "Nutrition for Weight Loss: What You Need to Know about Fad Diets" Try to lose between 1-2 pounds per week by taking in fewer calories and burning off more calories You can succeed by limiting portions, limiting foods dense in calories and fat, becoming more active, and drinking 8 glasses of water a day (64 ounces) Don't skip meals, especially breakfast, as skipping meals may alter your metabolism Do not use over-the-counter weight loss pills or gimmicks that claim rapid weight loss A healthy BMI (or body mass index) is between 18.5 and 24.9 You can calculate your ideal BMI at the NIH website [employer]huhttp://www.nhlbi.nih.gov/health/educational/lose_wt/BMI/bmicalc.htm

## 2016-04-14 NOTE — Progress Notes (Signed)
BP (!) 128/94   Pulse 88   Temp 97.9 F (36.6 C) (Oral)   Resp 14   Wt 280 lb 5 oz (127.1 kg)   SpO2 98%   BMI 35.99 kg/m    Subjective:    Patient ID: Jeffrey Cohen, male    DOB: 22-Jul-1975, 41 y.o.   MRN: [id number]  HPI: Jeffrey Cohen is a 41 y.o. male  Chief Complaint  Patient presents with  . Rash    all around pt neck started month ago.   Marland Kitchen Epistaxis    Every morning a week ago   . Headache   Small patch of dry skin in the front of the neck, then spot in the back of the neck; then went away and came back and now worse; now on the left shoulder; "itches outlandishly"  Hypertension; out of medicine since October or even longer; was having nosebleeds and headaches; ran out; he knows about DASH diet  Had vascular surgery; has compression stockings which work really well for him  He has an irritated skin tag lower left side anterior neck; bothers him, catches neck line of shirts  He is obese; weighed about 175 pounds when he graduated from high school; wife has undergone bariatric surgery  Depression screen [facility] 2/9 04/14/2016 02/24/2015  Decreased Interest 0 0  Down, Depressed, Hopeless 0 0  PHQ - 2 Score 0 0   Relevant past medical, surgical, family and social history reviewed Past Medical History:  Diagnosis Date  . Asthma    Past Surgical History:  Procedure Laterality Date  . TENDON REPAIR     left thumb   Family History  Problem Relation Age of Onset  . Cancer Mother     liver  . Emphysema Mother   . Hypertension Mother   . Diabetes Maternal Grandfather   . Heart disease Neg Hx   . COPD Neg Hx   . Stroke Neg Hx    Social History  Substance Use Topics  . Smoking status: Never Smoker  . Smokeless tobacco: Never Used  . Alcohol use Yes     Comment: occasional   Interim medical history since last visit reviewed. Allergies and medications reviewed  Review of Systems Per HPI unless specifically indicated above     Objective:    BP (!)  128/94   Pulse 88   Temp 97.9 F (36.6 C) (Oral)   Resp 14   Wt 280 lb 5 oz (127.1 kg)   SpO2 98%   BMI 35.99 kg/m   Wt Readings from Last 3 Encounters:  04/14/16 280 lb 5 oz (127.1 kg)  12/24/15 283 lb (128.4 kg)  08/05/15 275 lb (124.7 kg)    Physical Exam  Constitutional: He appears well-developed and well-nourished. No distress.  Eyes: No scleral icterus.  Cardiovascular: Normal rate and regular rhythm.   Pulmonary/Chest: Effort normal and breath sounds normal.  Neurological: He is alert.  Skin: Rash (lichenified hyperpigmented rash lateral aspect neck) noted.  Irritated skin tag lower anterior left side neck; velvety hyperpigmentation posterior nape of neck consistent with acanthosis nigricans  Psychiatric: He has a normal mood and affect.   No results found for this or any previous visit.    Assessment & Plan:   Problem List Items Addressed This Visit      Cardiovascular and Mediastinum   Essential hypertension, benign    Having headaches and nosebleeds, off of medicine; start back on medicine; try DASH guidelines; monitor BP at home  and contact me if not to goal; see AVS      Relevant Medications   losartan-hydrochlorothiazide (HYZAAR) 50-12.5 MG tablet     Musculoskeletal and Integument   Eczema    Start cream      Acrochordon    Cryo x 2 to irritated skin tag left side of neck; wound care discussed      Acanthosis nigricans    Check glucose and A1c; discussed risk of insulin resistance; work on weight loss; he'll try to get down to 227 pounds over the next year which will get his BMI out of the obesity category      Relevant Orders   Hemoglobin A1c     Other   Preventative health care    Check labs only      Relevant Orders   Lipid panel   COMPLETE METABOLIC PANEL WITH GFR   CBC with Differential/Platelet   TSH   Obesity - Primary    Let's aim for a goal 227 pounds over the next year; reviewed BMI calculator; try to lose 1 pound per week, see  AVS         Follow up plan: Return in about 6 months (around 10/12/2016) for weight check, fasting labs, visit with Dr. Sherie DonLada.  An after-visit summary was printed and given to the patient at check-out.  Please see the patient instructions which may contain other information and recommendations beyond what is mentioned above in the assessment and plan.  Meds ordered this encounter  Medications  . losartan-hydrochlorothiazide (HYZAAR) 50-12.5 MG tablet    Sig: Take 1 tablet by mouth daily.    Dispense:  90 tablet    Refill:  3  . triamcinolone ointment (KENALOG) 0.5 %    Sig: Apply 1 application topically 2 (two) times daily.    Dispense:  30 g    Refill:  0    Orders Placed This Encounter  Procedures  . Hemoglobin A1c  . Lipid panel  . COMPLETE METABOLIC PANEL WITH GFR  . CBC with Differential/Platelet  . TSH

## 2016-04-14 NOTE — Assessment & Plan Note (Signed)
Start cream

## 2016-04-14 NOTE — Assessment & Plan Note (Addendum)
Cryo x 2 to irritated skin tag left side of neck; wound care discussed

## 2016-04-14 NOTE — Assessment & Plan Note (Addendum)
Check glucose and A1c; discussed risk of insulin resistance; work on weight loss; he'll try to get down to 227 pounds over the next year which will get his BMI out of the obesity category

## 2016-04-14 NOTE — Assessment & Plan Note (Addendum)
Having headaches and nosebleeds, off of medicine; start back on medicine; try DASH guidelines; monitor BP at home and contact me if not to goal; see AVS

## 2016-04-14 NOTE — Assessment & Plan Note (Addendum)
Let's aim for a goal 227 pounds over the next year; reviewed BMI calculator; try to lose 1 pound per week, see AVS

## 2016-04-15 LAB — LIPID PANEL
CHOL/HDL RATIO: 4 ratio (ref <5.0)
Cholesterol: 212 mg/dL — ABNORMAL HIGH (ref <200)
HDL: 53 mg/dL (ref >40)
LDL Cholesterol: 142 mg/dL — ABNORMAL HIGH (ref <100)
Triglycerides: 85 mg/dL (ref <150)
VLDL: 17 mg/dL (ref <30)

## 2016-04-15 LAB — COMPLETE METABOLIC PANEL WITH GFR
ALT: 9 U/L (ref 9–46)
AST: 15 U/L (ref 10–40)
Albumin: 4.1 g/dL (ref 3.6–5.1)
Alkaline Phosphatase: 82 U/L (ref 40–115)
BUN: 13 mg/dL (ref 7–25)
CALCIUM: 9.5 mg/dL (ref 8.6–10.3)
CHLORIDE: 101 mmol/L (ref 98–110)
CO2: 28 mmol/L (ref 20–31)
Creat: 0.98 mg/dL (ref 0.60–1.35)
GFR, Est African American: >89 mL/min (ref >=60)
GLUCOSE: 88 mg/dL (ref 65–99)
POTASSIUM: 4.5 mmol/L (ref 3.5–5.3)
SODIUM: 139 mmol/L (ref 135–146)
Total Bilirubin: 0.7 mg/dL (ref 0.2–1.2)
Total Protein: 7.4 g/dL (ref 6.1–8.1)

## 2016-04-15 LAB — HEMOGLOBIN A1C
Hgb A1c MFr Bld: 5.4 % (ref <5.7)
Mean Plasma Glucose: 108 mg/dL

## 2016-04-20 ENCOUNTER — Ambulatory Visit: Payer: BC Managed Care – PPO | Admitting: Family Medicine

## 2016-08-10 ENCOUNTER — Encounter: Payer: Self-pay | Admitting: Family Medicine

## 2016-08-10 ENCOUNTER — Ambulatory Visit (INDEPENDENT_AMBULATORY_CARE_PROVIDER_SITE_OTHER): Payer: BC Managed Care – PPO | Admitting: Family Medicine

## 2016-08-10 VITALS — BP 146/94 | HR 78 | Temp 98.2°F | Resp 14 | Wt 290.9 lb

## 2016-08-10 DIAGNOSIS — I10 Essential (primary) hypertension: Secondary | ICD-10-CM | POA: Diagnosis not present

## 2016-08-10 DIAGNOSIS — M25462 Effusion, left knee: Secondary | ICD-10-CM

## 2016-08-10 DIAGNOSIS — E6609 Other obesity due to excess calories: Secondary | ICD-10-CM | POA: Diagnosis not present

## 2016-08-10 DIAGNOSIS — Z6837 Body mass index (BMI) 37.0-37.9, adult: Secondary | ICD-10-CM

## 2016-08-10 DIAGNOSIS — S86912A Strain of unspecified muscle(s) and tendon(s) at lower leg level, left leg, initial encounter: Secondary | ICD-10-CM | POA: Diagnosis not present

## 2016-08-10 NOTE — Progress Notes (Signed)
BP (!) 146/94 (BP Location: Left Arm)   Pulse 78   Temp 98.2 F (36.8 C) (Oral)   Resp 14   Wt 290 lb 14.4 oz (132 kg)   SpO2 96%   BMI 37.35 kg/m    Subjective:    Patient ID: Jeffrey Cohen, male    DOB: Oct 31, 1975, 41 y.o.   MRN: [id number]  HPI: Jeffrey Cohen is a 41 y.o. male  Chief Complaint  Patient presents with  . 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 No heat or ice; tried a pain rub topically Tried a brace the last few days Hinged brace; that helped too He tried to carry a steel ramp and really felt it; that was Saturday or Sunday No old sport injuries Knee is not red or hot   High blood pressure; taking cold medicine containing a decongestant; plus, he did not take his blood pressure medicine today  Depression screen [facility] 2/9 08/10/2016 04/14/2016 02/24/2015  Decreased Interest 0 0 0  Down, Depressed, Hopeless 0 0 0  PHQ - 2 Score 0 0 0    Relevant past medical, surgical, family and social history reviewed Past Medical History:  Diagnosis Date  . Asthma   . Essential hypertension, benign 03/02/2015  . Obesity 03/02/2015   Past Surgical History:  Procedure Laterality Date  . TENDON REPAIR     left thumb  . VEIN LIGATION AND STRIPPING Bilateral 10/29/2015   [id number]   Family History  Problem Relation Age of Onset  . Cancer Mother        liver  . Emphysema Mother   . Hypertension Mother   . Diabetes Maternal Grandfather   . Heart disease Neg Hx   . COPD Neg Hx   . Stroke Neg Hx    Social History   Social History  . Marital status: Married    Spouse name: N/A  . Number of children: N/A  . Years of education: N/A    Occupational History  . Not on file.   Social History Main Topics  . Smoking status: Never Smoker  . Smokeless tobacco: Never Used  . Alcohol use Yes     Comment: occasional  . Drug use: No  . Sexual activity: Not on file   Other Topics Concern  . Not on file   Social History Narrative  . No narrative on file    Interim medical history since last visit reviewed. Allergies and medications reviewed  Review of Systems Per HPI unless specifically indicated above     Objective:    BP (!) 146/94 (BP Location: Left Arm)   Pulse 78   Temp 98.2 F (36.8 C) (Oral)   Resp 14   Wt 290 lb 14.4 oz (132 kg)   SpO2 96%   BMI 37.35 kg/m   Wt Readings from Last 3 Encounters:  08/10/16 290 lb 14.4 oz (132 kg)  04/14/16 280 lb 5 oz (127.1 kg)  12/24/15 283 lb (128.4 kg)    Physical Exam  Constitutional: He appears well-developed and well-nourished. No distress.  obese  Eyes:  No scleral icterus.  Cardiovascular: Normal rate and regular rhythm.   Pulmonary/Chest: Effort normal and breath sounds normal.  Musculoskeletal:       Left knee: He exhibits decreased range of motion, swelling and effusion. He exhibits no deformity, no LCL laxity, normal patellar mobility and no MCL laxity. Tenderness found.  Neurological: He is alert.  Psychiatric: He has a normal mood and affect.      Assessment & Plan:   Problem List Items Addressed This Visit      Cardiovascular and Mediastinum   Essential hypertension, benign    Stressed the need to stop decongestants, start back on antihypertensive; he agrees to take his BP pill as soon as he gets home; discussed risk of stroke; weight loss, DASH guidelines; monitor and contact me if not to goal        Other   Obesity    Weight loss would help with his blood pressure       Other Visit Diagnoses    Knee strain, left, initial encounter    -  Primary   conservative treatment, brace, ice; avoid NSAIDs b/c of hypertension; contact me if  not improved   Knee effusion, left       suspect due to collateral ligament strain; may have small partial tear of meniscus; brace, conservative measures; contact me if not better       Follow up plan: No Follow-up on file.  An after-visit summary was printed and given to the patient at check-out.  Please see the patient instructions which may contain other information and recommendations beyond what is mentioned above in the assessment and plan.  No orders of the defined types were placed in this encounter.   No orders of the defined types were placed in this encounter.

## 2016-08-10 NOTE — Patient Instructions (Addendum)
Try to use PLAIN allergy medicine without the decongestant Avoid: phenylephrine, phenylpropanolamine, and pseudoephredine  Let's try topical ice 3-4 times a day If you need something for aches or pains, try to use Tylenol (acetaminophen) instead of non-steroidals (which include Aleve, ibuprofen, Advil, Motrin, and naproxen); non-steroidals can cause long-term kidney damage and high blood pressure  Continue brace  Please take your blood pressure medicine when you get home Monitor your pressure and call me if not under 140/90   DASH Eating Plan DASH stands for "Dietary Approaches to Stop Hypertension." The DASH eating plan is a healthy eating plan that has been shown to reduce high blood pressure (hypertension). It may also reduce your risk for type 2 diabetes, heart disease, and stroke. The DASH eating plan may also help with weight loss. What are tips for following this plan? General guidelines   Avoid eating more than 2,300 mg (milligrams) of salt (sodium) a day. If you have hypertension, you may need to reduce your sodium intake to 1,500 mg a day.  Limit alcohol intake to no more than 1 drink a day for nonpregnant women and 2 drinks a day for men. One drink equals 12 oz of beer, 5 oz of wine, or 1 oz of hard liquor.  Work with your health care provider to maintain a healthy body weight or to lose weight. Ask what an ideal weight is for you.  Get at least 30 minutes of exercise that causes your heart to beat faster (aerobic exercise) most days of the week. Activities may include walking, swimming, or biking.  Work with your health care provider or diet and nutrition specialist (dietitian) to adjust your eating plan to your individual calorie needs. Reading food labels   Check food labels for the amount of sodium per serving. Choose foods with less than 5 percent of the Daily Value of sodium. Generally, foods with less than 300 mg of sodium per serving fit into this eating plan.  To  find whole grains, look for the word "whole" as the first word in the ingredient list. Shopping   Buy products labeled as "low-sodium" or "no salt added."  Buy fresh foods. Avoid canned foods and premade or frozen meals. Cooking   Avoid adding salt when cooking. Use salt-free seasonings or herbs instead of table salt or sea salt. Check with your health care provider or pharmacist before using salt substitutes.  Do not fry foods. Cook foods using healthy methods such as baking, boiling, grilling, and broiling instead.  Cook with heart-healthy oils, such as olive, canola, soybean, or sunflower oil. Meal planning    Eat a balanced diet that includes:  5 or more servings of fruits and vegetables each day. At each meal, try to fill half of your plate with fruits and vegetables.  Up to 6-8 servings of whole grains each day.  Less than 6 oz of lean meat, poultry, or fish each day. A 3-oz serving of meat is about the same size as a deck of cards. One egg equals 1 oz.  2 servings of low-fat dairy each day.  A serving of nuts, seeds, or beans 5 times each week.  Heart-healthy fats. Healthy fats called Omega-3 fatty acids are found in foods such as flaxseeds and coldwater fish, like sardines, salmon, and mackerel.  Limit how much you eat of the following:  Canned or prepackaged foods.  Food that is high in trans fat, such as fried foods.  Food that is high in saturated fat,  such as fatty meat.  Sweets, desserts, sugary drinks, and other foods with added sugar.  Full-fat dairy products.  Do not salt foods before eating.  Try to eat at least 2 vegetarian meals each week.  Eat more home-cooked food and less restaurant, buffet, and fast food.  When eating at a restaurant, ask that your food be prepared with less salt or no salt, if possible. What foods are recommended? The items listed may not be a complete list. Talk with your dietitian about what dietary choices are best for  you. Grains  Whole-grain or whole-wheat bread. Whole-grain or whole-wheat pasta. Brown rice. [place] Cobbatmeal. Quinoa. Bulgur. Whole-grain and low-sodium cereals. Pita bread. Low-fat, low-sodium crackers. Whole-wheat flour tortillas. Vegetables  Fresh or frozen vegetables (raw, steamed, roasted, or grilled). Low-sodium or reduced-sodium tomato and vegetable juice. Low-sodium or reduced-sodium tomato sauce and tomato paste. Low-sodium or reduced-sodium canned vegetables. Fruits  All fresh, dried, or frozen fruit. Canned fruit in natural juice (without added sugar). Meat and other protein foods  Skinless chicken or [place]. Ground chicken or [place]. Pork with fat trimmed off. Fish and seafood. Egg whites. Dried beans, peas, or lentils. Unsalted nuts, nut butters, and seeds. Unsalted canned beans. Lean cuts of beef with fat trimmed off. Low-sodium, lean deli meat. Dairy  Low-fat (1%) or fat-free (skim) milk. Fat-free, low-fat, or reduced-fat cheeses. Nonfat, low-sodium ricotta or cottage cheese. Low-fat or nonfat yogurt. Low-fat, low-sodium cheese. Fats and oils  Soft margarine without trans fats. Vegetable oil. Low-fat, reduced-fat, or light mayonnaise and salad dressings (reduced-sodium). Canola, safflower, olive, soybean, and sunflower oils. Avocado. Seasoning and other foods  Herbs. Spices. Seasoning mixes without salt. Unsalted popcorn and pretzels. Fat-free sweets. What foods are not recommended? The items listed may not be a complete list. Talk with your dietitian about what dietary choices are best for you. Grains  Baked goods made with fat, such as croissants, muffins, or some breads. Dry pasta or rice meal packs. Vegetables  Creamed or fried vegetables. Vegetables in a cheese sauce. Regular canned vegetables (not low-sodium or reduced-sodium). Regular canned tomato sauce and paste (not low-sodium or reduced-sodium). Regular tomato and vegetable juice (not low-sodium or reduced-sodium). Rosita FirePickles.  Olives. Fruits  Canned fruit in a light or heavy syrup. Fried fruit. Fruit in cream or butter sauce. Meat and other protein foods  Fatty cuts of meat. Ribs. Fried meat. Tomasa BlaseBacon. Sausage. Bologna and other processed lunch meats. Salami. Fatback. Hotdogs. Bratwurst. Salted nuts and seeds. Canned beans with added salt. Canned or smoked fish. Whole eggs or egg yolks. Chicken or [place] with skin. Dairy  Whole or 2% milk, cream, and half-and-half. Whole or full-fat cream cheese. Whole-fat or sweetened yogurt. Full-fat cheese. Nondairy creamers. Whipped toppings. Processed cheese and cheese spreads. Fats and oils  Butter. Stick margarine. Lard. Shortening. Ghee. Bacon fat. Tropical oils, such as coconut, palm kernel, or palm oil. Seasoning and other foods  Salted popcorn and pretzels. Onion salt, garlic salt, seasoned salt, table salt, and sea salt. Worcestershire sauce. Tartar sauce. Barbecue sauce. Teriyaki sauce. Soy sauce, including reduced-sodium. Steak sauce. Canned and packaged gravies. Fish sauce. Oyster sauce. Cocktail sauce. Horseradish that you find on the shelf. Ketchup. Mustard. Meat flavorings and tenderizers. Bouillon cubes. Hot sauce and Tabasco sauce. Premade or packaged marinades. Premade or packaged taco seasonings. Relishes. Regular salad dressings. Where to find more information:  National Heart, Lung, and Blood Institute: PopSteam.iswww.nhlbi.nih.gov  American Heart Association: www.heart.org Summary  The DASH eating plan is a healthy eating plan that  has been shown to reduce high blood pressure (hypertension). It may also reduce your risk for type 2 diabetes, heart disease, and stroke.  With the DASH eating plan, you should limit salt (sodium) intake to 2,300 mg a day. If you have hypertension, you may need to reduce your sodium intake to 1,500 mg a day.  When on the DASH eating plan, aim to eat more fresh fruits and vegetables, whole grains, lean proteins, low-fat dairy, and heart-healthy  fats.  Work with your health care provider or diet and nutrition specialist (dietitian) to adjust your eating plan to your individual calorie needs. This information is not intended to replace advice given to you by your health care provider. Make sure you discuss any questions you have with your health care provider. Document Released: 02/23/2011 Document Revised: 02/28/2016 Document Reviewed: 02/28/2016 Elsevier Interactive Patient Education  2017 [employer].

## 2016-08-14 ENCOUNTER — Encounter: Payer: Self-pay | Admitting: Family Medicine

## 2016-08-14 NOTE — Assessment & Plan Note (Signed)
Weight loss would help with his blood pressure

## 2016-08-14 NOTE — Assessment & Plan Note (Signed)
Stressed the need to stop decongestants, start back on antihypertensive; he agrees to take his BP pill as soon as he gets home; discussed risk of stroke; weight loss, DASH guidelines; monitor and contact me if not to goal

## 2016-10-12 ENCOUNTER — Ambulatory Visit: Payer: BC Managed Care – PPO | Admitting: Family Medicine

## 2016-10-18 ENCOUNTER — Ambulatory Visit (INDEPENDENT_AMBULATORY_CARE_PROVIDER_SITE_OTHER): Payer: BC Managed Care – PPO | Admitting: Family Medicine

## 2016-10-18 ENCOUNTER — Encounter: Payer: Self-pay | Admitting: Family Medicine

## 2016-10-18 VITALS — BP 138/78 | HR 83 | Temp 98.0°F | Resp 14 | Wt 287.4 lb

## 2016-10-18 DIAGNOSIS — E6609 Other obesity due to excess calories: Secondary | ICD-10-CM

## 2016-10-18 DIAGNOSIS — Z6836 Body mass index (BMI) 36.0-36.9, adult: Secondary | ICD-10-CM

## 2016-10-18 DIAGNOSIS — E785 Hyperlipidemia, unspecified: Secondary | ICD-10-CM

## 2016-10-18 DIAGNOSIS — L83 Acanthosis nigricans: Secondary | ICD-10-CM | POA: Diagnosis not present

## 2016-10-18 DIAGNOSIS — Z5181 Encounter for therapeutic drug level monitoring: Secondary | ICD-10-CM | POA: Diagnosis not present

## 2016-10-18 DIAGNOSIS — I10 Essential (primary) hypertension: Secondary | ICD-10-CM | POA: Diagnosis not present

## 2016-10-18 DIAGNOSIS — R2242 Localized swelling, mass and lump, left lower limb: Secondary | ICD-10-CM | POA: Insufficient documentation

## 2016-10-18 LAB — BASIC METABOLIC PANEL
BUN: 14 mg/dL (ref 7–25)
CHLORIDE: 101 mmol/L (ref 98–110)
CO2: 31 mmol/L (ref 20–31)
Calcium: 9.3 mg/dL (ref 8.6–10.3)
Creat: 1.04 mg/dL (ref 0.60–1.35)
GLUCOSE: 81 mg/dL (ref 65–99)
POTASSIUM: 4.5 mmol/L (ref 3.5–5.3)
SODIUM: 138 mmol/L (ref 135–146)

## 2016-10-18 LAB — LIPID PANEL
CHOL/HDL RATIO: 4.2 ratio (ref <5.0)
Cholesterol: 228 mg/dL — ABNORMAL HIGH (ref <200)
HDL: 54 mg/dL (ref >40)
LDL CALC: 155 mg/dL — AB (ref <100)
TRIGLYCERIDES: 97 mg/dL (ref <150)
VLDL: 19 mg/dL (ref <30)

## 2016-10-18 NOTE — Assessment & Plan Note (Signed)
Get MRI of the LEFT lower extremity; r/o tumor, vascular anomaly, surgical changes; does not look like cellulitis or abscess

## 2016-10-18 NOTE — Assessment & Plan Note (Signed)
Last A1c normal; check yearly; glucose today

## 2016-10-18 NOTE — Assessment & Plan Note (Addendum)
Encouragement given; BMI dropped a point; significiant muscle mass noted

## 2016-10-18 NOTE — Assessment & Plan Note (Signed)
Check Cr and -lytes 

## 2016-10-18 NOTE — Patient Instructions (Addendum)
Two servings of tuna a week is the maximum recommended amount Keep trying healthy changes We'll get the MRI of your leg and contact you If you have not heard anything from my staff in a week about any orders/referrals/studies from today, please contact us here to follow-up [phone]  DASH Eating Plan DASH stands for "Dietary Approaches to Stop Hypertension." The DASH eating plan is a healthy eating plan that has been shown to reduce high blood pressure (hypertension). It may also reduce your risk for type 2 diabetes, heart disease, and stroke. The DASH eating plan may also help with weight loss. What are tips for following this plan? General guidelines  Avoid eating more than 2,300 mg (milligrams) of salt (sodium) a day. If you have hypertension, you may need to reduce your sodium intake to 1,500 mg a day.  Limit alcohol intake to no more than 1 drink a day for nonpregnant women and 2 drinks a day for men. One drink equals 12 oz of beer, 5 oz of wine, or 1 oz of hard liquor.  Work with your health care provider to maintain a healthy body weight or to lose weight. Ask what an ideal weight is for you.  Get at least 30 minutes of exercise that causes your heart to beat faster (aerobic exercise) most days of the week. Activities may include walking, swimming, or biking.  Work with your health care provider or diet and nutrition specialist (dietitian) to adjust your eating plan to your individual calorie needs. Reading food labels  Check food labels for the amount of sodium per serving. Choose foods with less than 5 percent of the Daily Value of sodium. Generally, foods with less than 300 mg of sodium per serving fit into this eating plan.  To find whole grains, look for the word "whole" as the first word in the ingredient list. Shopping  Buy products labeled as "low-sodium" or "no salt added."  Buy fresh foods. Avoid canned foods and premade or frozen meals. Cooking  Avoid adding  salt when cooking. Use salt-free seasonings or herbs instead of table salt or sea salt. Check with your health care provider or pharmacist before using salt substitutes.  Do not fry foods. Cook foods using healthy methods such as baking, boiling, grilling, and broiling instead.  Cook with heart-healthy oils, such as olive, canola, soybean, or sunflower oil. Meal planning   Eat a balanced diet that includes: ? 5 or more servings of fruits and vegetables each day. At each meal, try to fill half of your plate with fruits and vegetables. ? Up to 6-8 servings of whole grains each day. ? Less than 6 oz of lean meat, poultry, or fish each day. A 3-oz serving of meat is about the same size as a deck of cards. One egg equals 1 oz. ? 2 servings of low-fat dairy each day. ? A serving of nuts, seeds, or beans 5 times each week. ? Heart-healthy fats. Healthy fats called Omega-3 fatty acids are found in foods such as flaxseeds and coldwater fish, like sardines, salmon, and mackerel.  Limit how much you eat of the following: ? Canned or prepackaged foods. ? Food that is high in trans fat, such as fried foods. ? Food that is high in saturated fat, such as fatty meat. ? Sweets, desserts, sugary drinks, and other foods with added sugar. ? Full-fat dairy products.  Do not salt foods before eating.  Try to eat at least 2 vegetarian meals each week.  Eat more home-cooked food and less restaurant, buffet, and fast food.  When eating at a restaurant, ask that your food be prepared with less salt or no salt, if possible. What foods are recommended? The items listed may not be a complete list. Talk with your dietitian about what dietary choices are best for you. Grains Whole-grain or whole-wheat bread. Whole-grain or whole-wheat pasta. Brown rice. [place]. Bulgur. Whole-grain and low-sodium cereals. Pita bread. Low-fat, low-sodium crackers. Whole-wheat flour tortillas. Vegetables Fresh or frozen  vegetables (raw, steamed, roasted, or grilled). Low-sodium or reduced-sodium tomato and vegetable juice. Low-sodium or reduced-sodium tomato sauce and tomato paste. Low-sodium or reduced-sodium canned vegetables. Fruits All fresh, dried, or frozen fruit. Canned fruit in natural juice (without added sugar). Meat and other protein foods Skinless chicken or [place]. Ground chicken or [place]. Pork with fat trimmed off. Fish and seafood. Egg whites. Dried beans, peas, or lentils. Unsalted nuts, nut butters, and seeds. Unsalted canned beans. Lean cuts of beef with fat trimmed off. Low-sodium, lean deli meat. Dairy Low-fat (1%) or fat-free (skim) milk. Fat-free, low-fat, or reduced-fat cheeses. Nonfat, low-sodium ricotta or cottage cheese. Low-fat or nonfat yogurt. Low-fat, low-sodium cheese. Fats and oils Soft margarine without trans fats. Vegetable oil. Low-fat, reduced-fat, or light mayonnaise and salad dressings (reduced-sodium). Canola, safflower, olive, soybean, and sunflower oils. Avocado. Seasoning and other foods Herbs. Spices. Seasoning mixes without salt. Unsalted popcorn and pretzels. Fat-free sweets. What foods are not recommended? The items listed may not be a complete list. Talk with your dietitian about what dietary choices are best for you. Grains Baked goods made with fat, such as croissants, muffins, or some breads. Dry pasta or rice meal packs. Vegetables Creamed or fried vegetables. Vegetables in a cheese sauce. Regular canned vegetables (not low-sodium or reduced-sodium). Regular canned tomato sauce and paste (not low-sodium or reduced-sodium). Regular tomato and vegetable juice (not low-sodium or reduced-sodium). Angie Fava. Olives. Fruits Canned fruit in a light or heavy syrup. Fried fruit. Fruit in cream or butter sauce. Meat and other protein foods Fatty cuts of meat. Ribs. Fried meat. Berniece Salines. Sausage. Bologna and other processed lunch meats. Salami. Fatback. Hotdogs. Bratwurst.  Salted nuts and seeds. Canned beans with added salt. Canned or smoked fish. Whole eggs or egg yolks. Chicken or [place] with skin. Dairy Whole or 2% milk, cream, and half-and-half. Whole or full-fat cream cheese. Whole-fat or sweetened yogurt. Full-fat cheese. Nondairy creamers. Whipped toppings. Processed cheese and cheese spreads. Fats and oils Butter. Stick margarine. Lard. Shortening. Ghee. Bacon fat. Tropical oils, such as coconut, palm kernel, or palm oil. Seasoning and other foods Salted popcorn and pretzels. Onion salt, garlic salt, seasoned salt, table salt, and sea salt. Worcestershire sauce. Tartar sauce. Barbecue sauce. Teriyaki sauce. Soy sauce, including reduced-sodium. Steak sauce. Canned and packaged gravies. Fish sauce. Oyster sauce. Cocktail sauce. Horseradish that you find on the shelf. Ketchup. Mustard. Meat flavorings and tenderizers. Bouillon cubes. Hot sauce and Tabasco sauce. Premade or packaged marinades. Premade or packaged taco seasonings. Relishes. Regular salad dressings. Where to find more information:  National Heart, Lung, and [place]: https://wilson-eaton.com/  American Heart Association: www.heart.org Summary  The DASH eating plan is a healthy eating plan that has been shown to reduce high blood pressure (hypertension). It may also reduce your risk for type 2 diabetes, heart disease, and stroke.  With the DASH eating plan, you should limit salt (sodium) intake to 2,300 mg a day. If you have hypertension, you may need to reduce your sodium intake  to 1,500 mg a day.  When on the DASH eating plan, aim to eat more fresh fruits and vegetables, whole grains, lean proteins, low-fat dairy, and heart-healthy fats.  Work with your health care provider or diet and nutrition specialist (dietitian) to adjust your eating plan to your individual calorie needs. This information is not intended to replace advice given to you by your health care provider. Make sure you discuss any  questions you have with your health care provider. Document Released: 02/23/2011 Document Revised: 02/28/2016 Document Reviewed: 02/28/2016 Elsevier Interactive Patient Education  2017 [employer].

## 2016-10-18 NOTE — Assessment & Plan Note (Signed)
Limit cow and pig products; more whole grains, fresh fruits and veggies; check lipids

## 2016-10-18 NOTE — Assessment & Plan Note (Signed)
Fair control; encouraged weight loss and DASH guidelines and more activity; continue medicine

## 2016-10-18 NOTE — Progress Notes (Signed)
BP 138/78   Pulse 83   Temp 98 F (36.7 C) (Oral)   Resp 14   Wt 287 lb 6.4 oz (130.4 kg)   SpO2 96%   BMI 36.90 kg/m    Subjective:    Patient ID: Jeffrey Cohen, male    DOB: January 18, 1976, 41 y.o.   MRN: [id number]  HPI: Jeffrey Cohen is a 41 y.o. male  Chief Complaint  Patient presents with  . 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, paperwork and stress Trying to limit salt Avoiding decongestants He does not exercise as much as he'd like; less active at work since June  Obesity; lost 3.5 pounds since work change; changing diet; not gorging out on donuts; more tuna Drinking plenty of water through the day Total cholesterol in January was 212; LDL 142 Hard mass in the medial LEFT leg; he had the vein surgery; hard like that for a months; seems like it is growing; not painful; does not bother him; wearing compression stockings Acanthosis nigricans; GM with diabetes Lab Results  Component Value Date   HGBA1C 5.4 04/14/2016   Depression screen [facility] 2/9 10/18/2016 08/10/2016 04/14/2016 02/24/2015  Decreased Interest 0 0 0 0  Down, Depressed, Hopeless 0 0 0 0  PHQ - 2 Score 0 0 0 0   Relevant past medical, surgical, family and social history reviewed Past Medical History:  Diagnosis Date  . Asthma   . Essential hypertension, benign 03/02/2015  . Obesity 03/02/2015   Past Surgical History:  Procedure Laterality Date  . TENDON REPAIR     left thumb  . VEIN LIGATION AND STRIPPING Bilateral 10/29/2015   [id number]   Family History  Problem Relation Age of Onset  . Cancer Mother        liver  . Emphysema Mother   . Hypertension Mother   . Diabetes Maternal Grandfather   . Stroke Maternal Grandmother   .  Asthma Daughter   . Heart disease Neg Hx   . COPD Neg Hx    Social History   Social History  . Marital status: Married    Spouse name: N/A  . Number of children: N/A  . Years of education: N/A   Occupational History  . Not on file.   Social History Main Topics  . Smoking status: Never Smoker  . Smokeless tobacco: Never Used  . Alcohol use Yes     Comment: occasional  . Drug use: No  . Sexual activity: Yes   Other Topics Concern  . Not on file   Social History Narrative  . No narrative on file    Interim medical history since last visit reviewed. Allergies and medications reviewed  Review of Systems Per HPI unless specifically indicated above     Objective:    BP 138/78   Pulse 83   Temp 98 F (36.7 C) (Oral)   Resp 14   Wt 287 lb 6.4 oz (130.4 kg)   SpO2 96%   BMI 36.90 kg/m   Wt Readings from Last 3 Encounters:  10/18/16 287 lb 6.4 oz (130.4 kg)  08/10/16 290 lb 14.4 oz (132 kg)  04/14/16 280 lb 5 oz (127.1 kg)    Physical Exam  Constitutional: He appears well-developed and well-nourished. No distress.  Eyes: No scleral icterus.  Cardiovascular: Normal rate and regular rhythm.   Pulmonary/Chest: Effort normal and breath sounds normal.  Abdominal: He exhibits no distension.  Musculoskeletal: He exhibits no edema.       Legs: 6x6 cm firm irregular mass, dark hyperpigmented skin overlying area; no erythema, no fluctuance, no drainage; no peau d'orange changes  Neurological: He is alert.  Skin: No pallor.  Psychiatric: He has a normal mood and affect. His mood appears not anxious. He does not exhibit a depressed mood.    Results for orders placed or performed in visit on 04/14/16  Hemoglobin A1c  Result Value Ref Range   Hgb A1c MFr Bld 5.4 <5.7 %   Mean Plasma Glucose 108 mg/dL  Lipid panel  Result Value Ref Range   Cholesterol 212 (H) <200 mg/dL   Triglycerides 85 <150 mg/dL   HDL 53 >40 mg/dL   Total CHOL/HDL Ratio 4.0 <5.0 Ratio   VLDL 17  <30 mg/dL   LDL Cholesterol 142 (H) <100 mg/dL  COMPLETE METABOLIC PANEL WITH GFR  Result Value Ref Range   Sodium 139 135 - 146 mmol/L   Potassium 4.5 3.5 - 5.3 mmol/L   Chloride 101 98 - 110 mmol/L   CO2 28 20 - 31 mmol/L   Glucose, Bld 88 65 - 99 mg/dL   BUN 13 7 - 25 mg/dL   Creat 0.98 0.60 - 1.35 mg/dL   Total Bilirubin 0.7 0.2 - 1.2 mg/dL   Alkaline Phosphatase 82 40 - 115 U/L   AST 15 10 - 40 U/L   ALT 9 9 - 46 U/L   Total Protein 7.4 6.1 - 8.1 g/dL   Albumin 4.1 3.6 - 5.1 g/dL   Calcium 9.5 8.6 - 10.3 mg/dL   GFR, Est African American >89 >=60 mL/min   GFR, Est Non African American >89 >=60 mL/min  CBC with Differential/Platelet  Result Value Ref Range   WBC 5.5 3.8 - 10.8 K/uL   RBC 5.51 4.20 - 5.80 MIL/uL   Hemoglobin 15.5 13.2 - 17.1 g/dL   HCT 46.9 38.5 - 50.0 %   MCV 85.1 80.0 - 100.0 fL   MCH 28.1 27.0 - 33.0 pg   MCHC 33.0 32.0 - 36.0 g/dL   RDW 14.1 11.0 - 15.0 %   Platelets 281 140 - 400 K/uL   MPV 9.7 7.5 - 12.5 fL   Neutro Abs 2,860 1,500 - 7,800 cells/uL   Lymphs Abs 1,925 850 - 3,900 cells/uL   Monocytes Absolute 440 200 - 950 cells/uL   Eosinophils Absolute 275 15 - 500 cells/uL   Basophils Absolute 0 0 - 200 cells/uL   Neutrophils Relative % 52 %   Lymphocytes Relative 35 %   Monocytes Relative 8 %   Eosinophils Relative 5 %   Basophils Relative 0 %   Smear Review Criteria for review not met   TSH  Result Value Ref Range   TSH 0.75 0.40 - 4.50 mIU/L      Assessment & Plan:   Problem List Items Addressed This Visit      Cardiovascular and Mediastinum   Essential hypertension, benign    Fair control; encouraged weight loss and DASH guidelines and more activity; continue medicine        Musculoskeletal and Integument   Acanthosis nigricans    Last A1c normal; check yearly; glucose today        Other  Obesity - Primary    Encouragement given; BMI dropped a point; significiant muscle mass noted      Medication monitoring  encounter    Check Cr and -lytes      Relevant Orders   Basic Metabolic Panel (BMET)   Leg mass, left    Get MRI of the LEFT lower extremity; r/o tumor, vascular anomaly, surgical changes; does not look like cellulitis or abscess      Relevant Orders   MR TIBIA FIBULA LEFT W WO CONTRAST   Dyslipidemia, goal LDL below 130    Limit cow and pig products; more whole grains, fresh fruits and veggies; check lipids      Relevant Orders   Lipid panel       Follow up plan: Return in about 6 months (around 04/20/2017) for twenty minute follow-up with fasting labs.  An after-visit summary was printed and given to the patient at [facility].  Please see the patient instructions which may contain other information and recommendations beyond what is mentioned above in the assessment and plan.  No orders of the defined types were placed in this encounter.   Orders Placed This Encounter  Procedures  . MR TIBIA FIBULA LEFT W WO CONTRAST  . Basic Metabolic Panel (BMET)  . Lipid panel

## 2016-11-07 ENCOUNTER — Ambulatory Visit: Payer: BC Managed Care – PPO

## 2016-12-05 ENCOUNTER — Ambulatory Visit
Admission: RE | Admit: 2016-12-05 | Discharge: 2016-12-05 | Disposition: A | Discharge status: Home / Self Care | Payer: BC Managed Care – PPO | Source: Ambulatory Visit | Attending: Family Medicine | Admitting: Family Medicine

## 2016-12-05 DIAGNOSIS — M7989 Other specified soft tissue disorders: Secondary | ICD-10-CM | POA: Insufficient documentation

## 2016-12-05 DIAGNOSIS — R2242 Localized swelling, mass and lump, left lower limb: Secondary | ICD-10-CM | POA: Diagnosis present

## 2016-12-05 MED ORDER — GADOBENATE DIMEGLUMINE 529 MG/ML IV SOLN
20.0000 mL | Freq: Once | INTRAVENOUS | Status: AC | PRN
  Administered 2016-12-05: 20 mL via INTRAVENOUS

## 2016-12-18 ENCOUNTER — Encounter: Payer: Self-pay | Admitting: Family Medicine

## 2016-12-18 ENCOUNTER — Ambulatory Visit (INDEPENDENT_AMBULATORY_CARE_PROVIDER_SITE_OTHER): Payer: BC Managed Care – PPO | Admitting: Family Medicine

## 2016-12-18 DIAGNOSIS — I10 Essential (primary) hypertension: Secondary | ICD-10-CM | POA: Diagnosis not present

## 2016-12-18 DIAGNOSIS — E6609 Other obesity due to excess calories: Secondary | ICD-10-CM | POA: Diagnosis not present

## 2016-12-18 DIAGNOSIS — Z6836 Body mass index (BMI) 36.0-36.9, adult: Secondary | ICD-10-CM

## 2016-12-18 DIAGNOSIS — I83893 Varicose veins of bilateral lower extremities with other complications: Secondary | ICD-10-CM | POA: Diagnosis not present

## 2016-12-18 DIAGNOSIS — Z5181 Encounter for therapeutic drug level monitoring: Secondary | ICD-10-CM | POA: Diagnosis not present

## 2016-12-18 DIAGNOSIS — R2242 Localized swelling, mass and lump, left lower limb: Secondary | ICD-10-CM

## 2016-12-18 MED ORDER — LOSARTAN POTASSIUM-HCTZ 100-12.5 MG PO TABS: 1.0000 | ORAL_TABLET | Freq: Every day | ORAL | 3 refills | Status: DC

## 2016-12-18 NOTE — Assessment & Plan Note (Signed)
Left lower leg symptomatic with hardening; no ulceration; will refer back to Dr. Wyn Quaker; recommended compression stockings

## 2016-12-18 NOTE — Assessment & Plan Note (Signed)
Encouragement given 

## 2016-12-18 NOTE — Assessment & Plan Note (Signed)
Increase the blood pressure medcine; recheck BMP and BP check with CMA only; goal BP less than 140/90, ideally under 130/80; try the DASH guidelines, work on weight loss

## 2016-12-18 NOTE — Assessment & Plan Note (Signed)
Reviewed MRI report with him line by line; nothing to suggest cancer on the MRI; will refer back to vascular surgeon for evaluation and treatment

## 2016-12-18 NOTE — Progress Notes (Signed)
BP (!) 156/88 (BP Location: Right Arm, Patient Position: Sitting, Cuff Size: Normal)   Pulse 82   Temp 98.2 F (36.8 C) (Oral)   Resp 14   Ht  (1.88 m)   Wt 287 lb 1.6 oz (130.2 kg)   SpO2 98%   BMI 36.86 kg/m    Subjective:    Patient ID: Jeffrey Cohen, male    DOB: June 04, 1975, 41 y.o.   MRN: [id number]  HPI: Jeffrey Cohen is a 41 y.o. male  Chief Complaint  Patient presents with  . Results    F/u on MRI reuslts   HPI He is here for f/u Eating pumpkin seeds all day, salted which is why he thinks his BP is up today No decongestants No black licorice Taking blood pressure medicine Checks BP at home, getting readings a little bit high sometimes; high right after being at home Headaches at home with high blood pressure; no vision changes and no chest pain BP Readings from Last 3 Encounters:  12/18/16 (!) 156/88  10/18/16 138/78  08/10/16 (!) 146/94   Here to review the MRI done on Sept 18, 2018  IMPRESSION: 1. Soft tissue edema in the medial aspect of the mid left lower leg with overlying skin thickening which may be related to prior surgery versus focal cellulitis. No focal fluid collection to suggest an abscess or hematoma.   Electronically Signed   By: Elige Ko   On: 12/05/2016 11:21  Previous surgery with Dr. Wyn Quaker, August 2017 Vein stripping back to back, 8/11 and 8/25 The thickening and dark color change was already there; discoloration has gotten better, but the hardening developed and has progressed No night sweats and no unexplained weight loss   Depression screen [facility] 2/9 12/18/2016 10/18/2016 08/10/2016 04/14/2016 02/24/2015  Decreased Interest 0 0 0 0 0  Down, Depressed, Hopeless 0 0 0 0 0  PHQ - 2 Score 0 0 0 0 0    Relevant past medical, surgical, family and social history reviewed Past Medical History:  Diagnosis Date  . Asthma   . Essential hypertension, benign 03/02/2015  . Obesity 03/02/2015   Past Surgical History:  Procedure  Laterality Date  . TENDON REPAIR     left thumb  . VEIN LIGATION AND STRIPPING Bilateral 10/29/2015   [id number]   Family History  Problem Relation Age of Onset  . Cancer Mother        liver  . Emphysema Mother   . Hypertension Mother   . Diabetes Maternal Grandfather   . Stroke Maternal Grandmother   . Asthma Daughter   . Heart disease Neg Hx   . COPD Neg Hx    Social History   Social History  . Marital status: Married    Spouse name: N/A  . Number of children: N/A  . Years of education: N/A   Occupational History  . Not on file.   Social History Main Topics  . Smoking status: Never Smoker  . Smokeless tobacco: Never Used  . Alcohol use Yes     Comment: occasional  . Drug use: No  . Sexual activity: Yes   Other Topics Concern  . Not on file   Social History Narrative  . No narrative on file   Interim medical history since last visit reviewed. Allergies and medications reviewed  Review of Systems Per HPI unless specifically indicated above     Objective:    BP (!) 156/88 (BP Location: Right Arm, Patient Position: Sitting,  Cuff Size: Normal)   Pulse 82   Temp 98.2 F (36.8 C) (Oral)   Resp 14   Ht  (1.88 m)   Wt 287 lb 1.6 oz (130.2 kg)   SpO2 98%   BMI 36.86 kg/m   Wt Readings from Last 3 Encounters:  12/18/16 287 lb 1.6 oz (130.2 kg)  10/18/16 287 lb 6.4 oz (130.4 kg)  08/10/16 290 lb 14.4 oz (132 kg)    Physical Exam  Constitutional: He appears well-developed and well-nourished. No distress.  Eyes: No scleral icterus.  Cardiovascular: Normal rate and regular rhythm.   Pulmonary/Chest: Effort normal and breath sounds normal.  Abdominal: He exhibits no distension.  Musculoskeletal: He exhibits no edema.       Legs: 6x6 cm firm irregular mass, dark hyperpigmented skin overlying area; no erythema, no fluctuance, no drainage; no peau d'orange changes  Neurological: He is alert.  Skin: No pallor.  Psychiatric: He has a normal mood and  affect. His mood appears not anxious. He does not exhibit a depressed mood.    Results for orders placed or performed in visit on 10/18/16  Basic Metabolic Panel (BMET)  Result Value Ref Range   Sodium 138 135 - 146 mmol/L   Potassium 4.5 3.5 - 5.3 mmol/L   Chloride 101 98 - 110 mmol/L   CO2 31 20 - 31 mmol/L   Glucose, Bld 81 65 - 99 mg/dL   BUN 14 7 - 25 mg/dL   Creat 1.61 0.96 - 0.45 mg/dL   Calcium 9.3 8.6 - 40.9 mg/dL  Lipid panel  Result Value Ref Range   Cholesterol 228 (H) <200 mg/dL   Triglycerides 97 <811 mg/dL   HDL 54 >91 mg/dL   Total CHOL/HDL Ratio 4.2 <5.0 Ratio   VLDL 19 <30 mg/dL   LDL Cholesterol 478 (H) <100 mg/dL      Assessment & Plan:   Problem List Items Addressed This Visit      Cardiovascular and Mediastinum   Varicose veins of both lower extremities with complications    Left lower leg symptomatic with hardening; no ulceration; will refer back to Dr. Wyn Quaker; recommended compression stockings      Relevant Medications   losartan-hydrochlorothiazide (HYZAAR) 100-12.5 MG tablet   Other Relevant Orders   Ambulatory referral to Vascular Surgery   Essential hypertension, benign    Increase the blood pressure medcine; recheck BMP and BP check with CMA only; goal BP less than 140/90, ideally under 130/80; try the DASH guidelines, work on weight loss      Relevant Medications   losartan-hydrochlorothiazide (HYZAAR) 100-12.5 MG tablet     Other   Obesity    Encouragement given      Medication monitoring encounter    Check BMP in [redacted] weeks along with BP check with increase in the ARB      Relevant Orders   Basic metabolic panel   Leg mass, left    Reviewed MRI report with him line by line; nothing to suggest cancer on the MRI; will refer back to vascular surgeon for evaluation and treatment      Relevant Orders   Ambulatory referral to Vascular Surgery       Follow up plan: Return in about 2 weeks (around 01/01/2017) for blood pressure  recheck with CMA and non-fasting BMP.  An after-visit summary was printed and given to the patient at check-out.  Please see the patient instructions which may contain other information and recommendations beyond what  is mentioned above in the assessment and plan.  Meds ordered this encounter  Medications  . losartan-hydrochlorothiazide (HYZAAR) 100-12.5 MG tablet    Sig: Take 1 tablet by mouth daily.    Dispense:  90 tablet    Refill:  3    Changing dose, thank you    Orders Placed This Encounter  Procedures  . Basic metabolic panel  . Ambulatory referral to Vascular Surgery

## 2016-12-18 NOTE — Assessment & Plan Note (Signed)
Check BMP in [redacted] weeks along with BP check with increase in the ARB

## 2017-01-01 ENCOUNTER — Ambulatory Visit: Payer: BC Managed Care – PPO

## 2017-01-01 VITALS — BP 138/80 | HR 85

## 2017-01-01 DIAGNOSIS — Z5181 Encounter for therapeutic drug level monitoring: Secondary | ICD-10-CM

## 2017-01-01 DIAGNOSIS — I10 Essential (primary) hypertension: Secondary | ICD-10-CM

## 2017-01-01 LAB — BASIC METABOLIC PANEL
BUN: 11 mg/dL (ref 7–25)
CO2: 31 mmol/L (ref 20–32)
Calcium: 9.3 mg/dL (ref 8.6–10.3)
Chloride: 101 mmol/L (ref 98–110)
Creat: 0.96 mg/dL (ref 0.60–1.35)
Glucose, Bld: 104 mg/dL (ref 65–139)
Potassium: 4.1 mmol/L (ref 3.5–5.3)
Sodium: 137 mmol/L (ref 135–146)

## 2017-01-02 ENCOUNTER — Telehealth: Payer: Self-pay

## 2017-01-02 NOTE — Telephone Encounter (Signed)
-----   Message from Kerman Passey, MD sent at 01/01/2017  4:54 PM EDT ----- Please let pt know that his labs look great; thank you

## 2017-01-02 NOTE — Telephone Encounter (Signed)
Called pt no answer. LM for pt informing him of normal labs. To call back for questions or concerns.

## 2017-01-05 ENCOUNTER — Ambulatory Visit: Payer: BC Managed Care – PPO | Admitting: Family Medicine

## 2017-01-29 ENCOUNTER — Ambulatory Visit (INDEPENDENT_AMBULATORY_CARE_PROVIDER_SITE_OTHER): Payer: BC Managed Care – PPO | Admitting: Vascular Surgery

## 2017-01-29 ENCOUNTER — Encounter (INDEPENDENT_AMBULATORY_CARE_PROVIDER_SITE_OTHER): Payer: Self-pay | Admitting: Vascular Surgery

## 2017-01-29 ENCOUNTER — Encounter (INDEPENDENT_AMBULATORY_CARE_PROVIDER_SITE_OTHER): Payer: Self-pay

## 2017-01-29 VITALS — BP 154/103 | HR 69 | Resp 16 | Ht 73.0 in | Wt 293.0 lb

## 2017-01-29 DIAGNOSIS — I872 Venous insufficiency (chronic) (peripheral): Secondary | ICD-10-CM

## 2017-01-29 DIAGNOSIS — E785 Hyperlipidemia, unspecified: Secondary | ICD-10-CM

## 2017-01-29 DIAGNOSIS — I10 Essential (primary) hypertension: Secondary | ICD-10-CM | POA: Diagnosis not present

## 2017-01-29 DIAGNOSIS — I83893 Varicose veins of bilateral lower extremities with other complications: Secondary | ICD-10-CM | POA: Diagnosis not present

## 2017-01-31 ENCOUNTER — Encounter (INDEPENDENT_AMBULATORY_CARE_PROVIDER_SITE_OTHER): Payer: Self-pay | Admitting: Vascular Surgery

## 2017-01-31 DIAGNOSIS — I872 Venous insufficiency (chronic) (peripheral): Secondary | ICD-10-CM | POA: Insufficient documentation

## 2017-01-31 NOTE — Progress Notes (Signed)
MRN : [id number]  Jeffrey Cohen is a 41 y.o. (1975-04-25) male who presents with chief complaint of  Chief Complaint  Patient presents with  . New Patient (Initial Visit)    Leg hardening and discoloration  .  History of Present Illness: Patient is seen for evaluation of leg pain and leg swelling associated with significant venous stasis dermatitis changes of the left ankle. The patient first noticed the swelling and skin changes remotely. The swelling is associated with pain and discoloration. The pain and swelling worsens with prolonged dependency and improves with elevation.  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  The pain is unrelated to activity such as walking.  The patient notes that in the morning the legs are significantly improved but they steadily worsened throughout the course of the day. The patient also notes a steady worsening of the discoloration in the ankle and shin area.   The patient denies claudication symptoms.  The patient denies symptoms consistent with rest pain.  The patient denies and extensive history of DJD and LS spine disease.  The patient has no had any past angiography, interventions or vascular surgery.  Elevation makes the leg symptoms better, dependency makes them much worse. There is no history of ulcerations. The patient denies any recent changes in medications.  The patient has not been wearing firm graduated compression on a routine basis.  He has been using graduated compression sometimes firm sometimes medium intermittently.  The patient denies a history of DVT or PE. There is no prior history of phlebitis. There is no history of primary lymphedema.  No history of malignancies. No history of trauma or groin or pelvic surgery. There is no history of radiation treatment to the groin or pelvis  The patient denies amaurosis fugax or recent TIA symptoms. There are  no recent neurological changes noted. The patient denies recent episodes of angina or shortness of breath  Current Meds  Medication Sig  . losartan-hydrochlorothiazide (HYZAAR) 100-12.5 MG tablet Take 1 tablet by mouth daily.    Past Medical History:  Diagnosis Date  . Asthma   . Essential hypertension, benign 03/02/2015  . Obesity 03/02/2015    Past Surgical History:  Procedure Laterality Date  . TENDON REPAIR     left thumb  . VEIN LIGATION AND STRIPPING Bilateral 10/29/2015   [id number]    Social History Social History   Tobacco Use  . Smoking status: Never Smoker  . Smokeless tobacco: Never Used  Substance Use Topics  . Alcohol use: Yes    Comment: occasional  . Drug use: No    Family History Family History  Problem Relation Age of Onset  . Cancer Mother        liver  . Emphysema Mother   . Hypertension Mother   . Diabetes Maternal Grandfather   . Stroke Maternal Grandmother   . Asthma Daughter   . Heart disease Neg Hx   . COPD Neg Hx   No family history of bleeding/clotting disorders, porphyria or autoimmune disease   No Known Allergies   REVIEW OF SYSTEMS (Negative unless checked)  Constitutional: [] Weight loss  [] Fever  [] Chills Cardiac: [] Chest pain   [] Chest pressure   [] Palpitations   [] Shortness of breath when laying flat   [] Shortness of breath with exertion. Vascular:  [] Pain in legs with walking   [x] Pain in legs with standing [] History of DVT   []   Phlebitis   [x] Swelling in legs   [x] Varicose veins   [] Non-healing ulcers Pulmonary:   [] Uses home oxygen   [] Productive cough   [] Hemoptysis   [] Wheeze  [] COPD   [] Asthma Neurologic:  [] Dizziness   [] Seizures   [] History of stroke   [] History of TIA  [] Aphasia   [] Vissual changes   [] Weakness or numbness in arm   [] Weakness or numbness in leg Musculoskeletal:   [] Joint swelling   [] Joint pain   [] Low back pain Hematologic:  [] Easy bruising  [] Easy bleeding   [] Hypercoagulable state    [] Anemic Gastrointestinal:  [] Diarrhea   [] Vomiting  [] Gastroesophageal reflux/heartburn   [] Difficulty swallowing. Genitourinary:  [] Chronic kidney disease   [] Difficult urination  [] Frequent urination   [] Blood in urine Skin:  [] Rashes   [] Ulcers  Psychological:  [] History of anxiety   []  History of major depression.  Physical Examination  Vitals:   01/29/17 1309  BP: (!) 154/103  Pulse: 69  Resp: 16  Weight: 132.9 kg (293 lb)  Height: 6\' 1"  (1.854 m)   Body mass index is 38.66 kg/m. Gen: WD/WN, NAD Head: [place]/AT, No temporalis wasting.  Ear/Nose/Throat: Hearing grossly intact, nares w/o erythema or drainage Eyes: PER, EOMI, sclera nonicteric.  Neck: Supple, no large masses.   Pulmonary:  Good air movement, no audible wheezing bilaterally, no use of accessory muscles.  Cardiac: RRR, no JVD Vascular: Varicosities present bilaterally.  Severe venous stasis changes to the left leg.  3+ soft pitting edema Vessel Right Left  Radial Palpable Palpable  PT  Palpable  DP  Palpable  Gastrointestinal: Non-distended. No guarding/no peritoneal signs.  Musculoskeletal: M/S 5/5 throughout.  No deformity or atrophy.  Neurologic: CN 2-12 intact. Symmetrical.  Speech is fluent. Motor exam as listed above. Psychiatric: Judgment intact, Mood & affect appropriate for pt's clinical situation. Dermatologic: No rashes or ulcers noted.  No changes consistent with cellulitis. Lymph : No lichenification or skin changes of chronic lymphedema.  CBC Lab Results  Component Value Date   WBC 5.5 04/14/2016   HGB 15.5 04/14/2016   HCT 46.9 04/14/2016   MCV 85.1 04/14/2016   PLT 281 04/14/2016    BMET    Component Value Date/Time   NA 137 01/01/2017 1033   K 4.1 01/01/2017 1033   CL 101 01/01/2017 1033   CO2 31 01/01/2017 1033   GLUCOSE 104 01/01/2017 1033   BUN 11 01/01/2017 1033   CREATININE 0.96 01/01/2017 1033   CALCIUM 9.3 01/01/2017 1033   GFRNONAA >89 04/14/2016 1100   GFRAA >89  04/14/2016 1100   CrCl cannot be calculated (Patient's most recent lab result is older than the maximum 21 days allowed.).  COAG No results found for: INR, PROTIME  Radiology No results found.   Assessment/Plan 1. Chronic venous insufficiency No surgery or intervention at this point in time.    I have had a long discussion with the patient regarding venous insufficiency and why it  causes symptoms. I have discussed with the patient the chronic skin changes that accompany venous insufficiency and the long term sequela such as infection and ulceration.  Patient will begin wearing graduated compression stockings class 1 (20-30 mmHg) or compression wraps on a daily basis a prescription was given. The patient will put the stockings on first thing in the morning and removing them in the evening. The patient is instructed specifically not to sleep in the stockings.    In addition, behavioral modification including several periods of elevation of the lower  extremities during the day will be continued. I have demonstrated that proper elevation is a position with the ankles at heart level.  The patient is instructed to begin routine exercise, especially walking on a daily basis  The patient will follow up PRN to reassess the degree of swelling and the control that graduated compression stockings or compression wraps  is offering.   The patient can be assessed for a Lymph Pump at that time  2. Varicose veins of both lower extremities with complications See #1  3. Essential hypertension, benign Continue antihypertensive medications as already ordered, these medications have been reviewed and there are no changes at this time.   4. Dyslipidemia, goal LDL below 130 Continue statin as ordered and reviewed, no changes at this time     Levora DredgeGregory Ben Sanz, MD  01/31/2017 10:05 AM

## 2017-04-20 ENCOUNTER — Ambulatory Visit: Payer: BC Managed Care – PPO | Admitting: Family Medicine

## 2017-04-26 ENCOUNTER — Ambulatory Visit: Payer: BC Managed Care – PPO | Admitting: Family Medicine

## 2017-04-26 ENCOUNTER — Encounter: Payer: Self-pay | Admitting: Family Medicine

## 2017-04-26 VITALS — BP 126/80 | HR 98 | Temp 98.2°F | Ht 73.0 in | Wt 295.3 lb

## 2017-04-26 DIAGNOSIS — E6609 Other obesity due to excess calories: Secondary | ICD-10-CM | POA: Diagnosis not present

## 2017-04-26 DIAGNOSIS — E785 Hyperlipidemia, unspecified: Secondary | ICD-10-CM

## 2017-04-26 DIAGNOSIS — R040 Epistaxis: Secondary | ICD-10-CM

## 2017-04-26 DIAGNOSIS — Q309 Congenital malformation of nose, unspecified: Secondary | ICD-10-CM

## 2017-04-26 DIAGNOSIS — M79632 Pain in left forearm: Secondary | ICD-10-CM

## 2017-04-26 DIAGNOSIS — Z6838 Body mass index (BMI) 38.0-38.9, adult: Secondary | ICD-10-CM

## 2017-04-26 MED ORDER — LOSARTAN POTASSIUM 100 MG PO TABS: 100.0000 mg | ORAL_TABLET | Freq: Every day | ORAL | 3 refills | Status: DC

## 2017-04-26 NOTE — Assessment & Plan Note (Signed)
Check lipids after 12 weeks of TLC; discussed diet; will work exercise into the schedule; more movement helps

## 2017-04-26 NOTE — Assessment & Plan Note (Addendum)
Drink more water; encouraged weight loss; see AVS

## 2017-04-26 NOTE — Patient Instructions (Signed)
Try to follow the DASH guidelines (DASH stands for Dietary Approaches to Stop Hypertension). Try to limit the sodium in your diet to no more than 1,500mg  of sodium per day. Certainly try to not exceed 2,000 mg per day at the very most. Do not add salt when cooking or at the table.  Check the sodium amount on labels when shopping, and choose items lower in sodium when given a choice. Avoid or limit foods that already contain a lot of sodium. Eat a diet rich in fruits and vegetables and whole grains, and try to lose weight if overweight or obese Check out the information at familydoctor.org entitled "Nutrition for Weight Loss: What You Need to Know about Fad Diets" Try to lose between 1-2 pounds per week by taking in fewer calories and burning off more calories You can succeed by limiting portions, limiting foods dense in calories and fat, becoming more active, and drinking 8 glasses of water a day (64 ounces) Don't skip meals, especially breakfast, as skipping meals may alter your metabolism Do not use over-the-counter weight loss pills or gimmicks that claim rapid weight loss A healthy BMI (or body mass index) is between 18.5 and 24.9 You can calculate your ideal BMI at the NIH website [employer]huhttp://www.nhlbi.nih.gov/health/educational/lose_wt/BMI/bmicalc.htm Switch the medicine to plain losartan If BP trending up in the next two weeks, call me and I'll add a different medicine I've referred you to the Ear Nose Throat doctor If you have not heard anything from my staff in two days about any orders/referrals/studies from today, please contact us here to follow-up [phone] Return in 3 months just for fasting labs

## 2017-04-26 NOTE — Progress Notes (Signed)
BP 126/80 (BP Location: Left Arm, Patient Position: Sitting, Cuff Size: Large)   Pulse 98   Temp 98.2 F (36.8 C) (Oral)   Ht 6\' 1"  (1.854 m)   Wt 295 lb 4.8 oz (133.9 kg)   SpO2 98%   BMI 38.96 kg/m    Subjective:    Patient ID: Jeffrey Cohen, male    DOB: [birth year], 42 y.o.   MRN: [id number]  HPI: Jeffrey DibblesWilliam Wyke is a 42 y.o. male  Chief Complaint  Patient presents with  . Follow-up    Concerned that BP meds are "too strong"   . 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 Hypertension; wonders if the medicine is too strong; headaches that comes with the nosebleeds; could be stress; no sinus pressure No chest pain or SHOB Checks BP when these episodes occur, no high pressures Uses nasal saline Not regular afrin user Staying away from salt  High cholesterol; not sure if runs in family; no fam hx  Wearing compression sleeves bilaterally He had an MRI and they ran contrast through his arm, left; having random pains in the arm since the IV was put in; he thinks she blew a vein; some contrast got it; he is either doing something or can even just be sitting and it will start throbbing; not as frequent in the last 2-3 weeks; every once in a while get a pain; works it out and goes away; LEFT; no loss of strength happened every day at first, then gradually getting better  [facility] 04/26/2017 12/18/2016 10/18/2016 08/10/2016 04/14/2016  Decreased Interest 0 0 0 0 0  Down, Depressed, Hopeless 0 0 0 0 0  PHQ - 2 Score 0 0 0 0 0    Relevant past medical, surgical, family and social history reviewed Past Medical History:  Diagnosis Date  . Asthma   . Essential hypertension, benign 03/02/2015  . Obesity 03/02/2015    Past Surgical History:  Procedure Laterality Date  . TENDON REPAIR     left thumb  . VEIN LIGATION AND STRIPPING Bilateral 10/29/2015   [id number]   Family History  Problem Relation Age of Onset  . Cancer Mother        liver  . Emphysema Mother   . Hypertension Mother   . Diabetes Maternal Grandfather   . Stroke Maternal Grandmother   . Asthma Daughter   . Heart disease Neg Hx   . COPD Neg Hx    Social History   Tobacco Use  . Smoking status: Never Smoker  . Smokeless tobacco: Never Used  Substance Use Topics  . Alcohol use: Yes    Comment: occasional  . Drug use: No    Interim medical history since last visit reviewed. Allergies and medications reviewed  Review of Systems Per HPI unless specifically indicated above     Objective:    BP 126/80 (BP Location: Left Arm, Patient Position: Sitting, Cuff Size: Large)   Pulse 98   Temp 98.2 F (36.8 C) (Oral)   Ht 6\' 1"  (1.854 m)   Wt 295 lb 4.8 oz (133.9 kg)   SpO2 98%   BMI 38.96  kg/m   Wt Readings from Last 3 Encounters:  04/26/17 295 lb 4.8 oz (133.9 kg)  01/29/17 293 lb (132.9 kg)  12/18/16 287 lb 1.6 oz (130.2 kg)    Physical Exam  Constitutional: He appears well-developed and well-nourished. No distress.  HENT:  Mouth/Throat: Oropharynx is clear and moist and mucous membranes are normal.  Shallow ulceration LEFT nasal septum; no active bleeding  Eyes: No scleral icterus.  Cardiovascular: Normal rate and regular rhythm.  Pulmonary/Chest: Effort normal and breath sounds normal.  Neurological: He is alert.  Skin: No pallor.  Psychiatric: He has a normal mood and affect.    Results for orders placed or performed in visit on 01/01/17  Basic metabolic panel  Result Value Ref Range   Glucose, Bld 104 65 - 139 mg/dL   BUN 11 7 - 25 mg/dL   Creat 5.62 1.30 - 8.65 mg/dL   BUN/Creatinine Ratio NOT APPLICABLE 6 - 22 (calc)   Sodium 137 135 - 146 mmol/L   Potassium 4.1 3.5 - 5.3 mmol/L   Chloride 101 98  - 110 mmol/L   CO2 31 20 - 32 mmol/L   Calcium 9.3 8.6 - 10.3 mg/dL      Assessment & Plan:   Problem List Items Addressed This Visit      Other   Dyslipidemia, goal LDL below 130    Check lipids after 12 weeks of TLC; discussed diet; will work exercise into the schedule; more movement helps      Relevant Medications   losartan (COZAAR) 100 MG tablet   Other Relevant Orders   Lipid panel   Obesity    Drink more water; encouraged weight loss; see AVS       Other Visit Diagnoses    Left-sided epistaxis    -  Primary   Relevant Orders   Ambulatory referral to ENT   Abnormal nasal septum       Relevant Orders   Ambulatory referral to ENT   Pain in left forearm       after contrast administration; try ice topically through protection, 15 minutes; call me if not better in 2 weeks       Follow up plan: No Follow-up on file.  An after-visit summary was printed and given to the patient at check-out.  Please see the patient instructions which may contain other information and recommendations beyond what is mentioned above in the assessment and plan.  Meds ordered this encounter  Medications  . losartan (COZAAR) 100 MG tablet    Sig: Take 1 tablet (100 mg total) by mouth daily.    Dispense:  90 tablet    Refill:  3    We're stopping the thiazide component    Orders Placed This Encounter  Procedures  . Lipid panel  . Ambulatory referral to ENT

## 2017-07-24 ENCOUNTER — Ambulatory Visit: Payer: BC Managed Care – PPO | Admitting: Family Medicine

## 2017-07-24 ENCOUNTER — Encounter: Payer: Self-pay | Admitting: Family Medicine

## 2017-07-24 VITALS — BP 132/80 | HR 95 | Temp 98.4°F | Resp 14 | Ht 73.0 in | Wt 293.7 lb

## 2017-07-24 DIAGNOSIS — Z6839 Body mass index (BMI) 39.0-39.9, adult: Secondary | ICD-10-CM

## 2017-07-24 DIAGNOSIS — E6609 Other obesity due to excess calories: Secondary | ICD-10-CM

## 2017-07-24 DIAGNOSIS — R7309 Other abnormal glucose: Secondary | ICD-10-CM

## 2017-07-24 DIAGNOSIS — E785 Hyperlipidemia, unspecified: Secondary | ICD-10-CM

## 2017-07-24 DIAGNOSIS — I10 Essential (primary) hypertension: Secondary | ICD-10-CM | POA: Diagnosis not present

## 2017-07-24 DIAGNOSIS — L83 Acanthosis nigricans: Secondary | ICD-10-CM

## 2017-07-24 NOTE — Patient Instructions (Addendum)
Return in one month for fasting labs I'm here for you if needed Check out the information at familydoctor.org entitled "Nutrition for Weight Loss: What You Need to Know about Fad Diets" Try to lose between 1-2 pounds per week by taking in fewer calories and burning off more calories You can succeed by limiting portions, limiting foods dense in calories and fat, becoming more active, and drinking 8 glasses of water a day (64 ounces) Don't skip meals, especially breakfast, as skipping meals may alter your metabolism Do not use over-the-counter weight loss pills or gimmicks that claim rapid weight loss A healthy BMI (or body mass index) is between 18.5 and 24.9 You can calculate your ideal BMI at the NIH website [employer]

## 2017-07-24 NOTE — Assessment & Plan Note (Signed)
Check lipids on another day; healthier diet and weight loss would likely help; patient has been out of sorts with things after losing his mother just recently; he'll get back on track soon; return in one month for labs

## 2017-07-24 NOTE — Progress Notes (Signed)
BP 132/80   Pulse 95   Temp 98.4 F (36.9 C) (Oral)   Resp 14   Ht  (1.854 m)   Wt 293 lb 11.2 oz (133.2 kg)   SpO2 97%   BMI 38.75 kg/m    Subjective:    Patient ID: Jeffrey Cohen, male    DOB: 1975-08-11, 42 y.o.   MRN: [id number]  HPI: Tra Jeffrey Cohen is a 42 y.o. male  Chief Complaint  Patient presents with  . Follow-up    3 months    HPI Patient is here for f/u BP up today Mother passed away two weeks ago Mother had hernia in her abdomen; they fixed it; she aspirated when being intubated; two days after surgery, just declined and never recovered; 42 years old; post-operative complications; no heart attack, no stroke Good support with aunts; sleep is off Eating has been atypical lately He would like to return for labs on another day Last LDL was 155, last total chol was 228 Last glucose was 104, nonfasting  Depression screen [facility] 2/9 07/24/2017 04/26/2017 12/18/2016 10/18/2016 08/10/2016  Decreased Interest 0 0 0 0 0  Down, Depressed, Hopeless 1 0 0 0 0  PHQ - 2 Score 1 0 0 0 0    Relevant past medical, surgical, family and social history reviewed Past Medical History:  Diagnosis Date  . Asthma   . Essential hypertension, benign 03/02/2015  . Obesity 03/02/2015   Past Surgical History:  Procedure Laterality Date  . TENDON REPAIR     left thumb  . VEIN LIGATION AND STRIPPING Bilateral 10/29/2015   [id number]   Family History  Problem Relation Age of Onset  . Cancer Mother        liver  . Emphysema Mother   . Hypertension Mother   . Diabetes Maternal Grandfather   . Stroke Maternal Grandmother   . Asthma Daughter   . Heart disease Neg Hx   . COPD Neg Hx    Social History   Tobacco Use  . Smoking status: Never Smoker  . Smokeless tobacco: Never Used  Substance Use Topics  . Alcohol use: Yes    Comment: occasional  . Drug use: No    Interim medical history since last visit reviewed. Allergies and medications reviewed  Review of Systems Per  HPI unless specifically indicated above     Objective:    BP 132/80   Pulse 95   Temp 98.4 F (36.9 C) (Oral)   Resp 14   Ht  (1.854 m)   Wt 293 lb 11.2 oz (133.2 kg)   SpO2 97%   BMI 38.75 kg/m   Wt Readings from Last 3 Encounters:  07/24/17 293 lb 11.2 oz (133.2 kg)  04/26/17 295 lb 4.8 oz (133.9 kg)  01/29/17 293 lb (132.9 kg)    Physical Exam  Constitutional: He appears well-developed and well-nourished. No distress.  obese  Eyes: No scleral icterus.  Cardiovascular: Normal rate and regular rhythm.  Pulmonary/Chest: Effort normal and breath sounds normal.  Neurological: He is alert.  Skin: No pallor.  Psychiatric: He has a normal mood and affect.   Results for orders placed or performed in visit on 01/01/17  Basic metabolic panel  Result Value Ref Range   Glucose, Bld 104 65 - 139 mg/dL   BUN 11 7 - 25 mg/dL   Creat 9.56 2.13 - 0.86 mg/dL   BUN/Creatinine Ratio NOT APPLICABLE 6 - 22 (calc)   Sodium 137 135 -  146 mmol/L   Potassium 4.1 3.5 - 5.3 mmol/L   Chloride 101 98 - 110 mmol/L   CO2 31 20 - 32 mmol/L   Calcium 9.3 8.6 - 10.3 mg/dL      Assessment & Plan:   Problem List Items Addressed This Visit      Cardiovascular and Mediastinum   Essential hypertension, benign    Continue current meds; he will monitor BP and notify me if not controlled; weight loss, DASH guidelines; meds        Musculoskeletal and Integument   Acanthosis nigricans    Check A1c, glucose      Relevant Orders   Basic metabolic panel   Hemoglobin A1c     Other   Obesity    I did not spend much time on this today, since his mother just died two weeks ago; this is something he has been working on and we have discussed; he says he'll try to get back in the swing of things      Dyslipidemia, goal LDL below 130    Check lipids on another day; healthier diet and weight loss would likely help; patient has been out of sorts with things after losing his mother just recently;  he'll get back on track soon; return in one month for labs      Relevant Orders   Lipid panel    Other Visit Diagnoses    Elevated glucose    -  Primary   Relevant Orders   Basic metabolic panel   Hemoglobin A1c       Follow up plan: Return in about 6 months (around 01/24/2018) for follow-up visit with Dr. Sherie Don; one month for labs only.  An after-visit summary was printed and given to the patient at check-out.  Please see the patient instructions which may contain other information and recommendations beyond what is mentioned above in the assessment and plan.  No orders of the defined types were placed in this encounter.   Orders Placed This Encounter  Procedures  . Basic metabolic panel  . Hemoglobin A1c  . Lipid panel

## 2017-07-24 NOTE — Assessment & Plan Note (Signed)
Check A1c, glucose 

## 2017-07-24 NOTE — Assessment & Plan Note (Signed)
I did not spend much time on this today, since his mother just died two weeks ago; this is something he has been working on and we have discussed; he says he'll try to get back in the swing of things

## 2017-07-24 NOTE — Assessment & Plan Note (Signed)
Continue current meds; he will monitor BP and notify me if not controlled; weight loss, DASH guidelines; meds

## 2017-09-05 ENCOUNTER — Telehealth: Payer: Self-pay | Admitting: Nurse Practitioner

## 2017-09-05 ENCOUNTER — Encounter: Payer: Self-pay | Admitting: Nurse Practitioner

## 2017-09-05 ENCOUNTER — Ambulatory Visit: Payer: BC Managed Care – PPO | Admitting: Nurse Practitioner

## 2017-09-05 VITALS — BP 126/86 | HR 82 | Temp 98.0°F | Resp 18 | Ht 73.0 in | Wt 299.7 lb

## 2017-09-05 DIAGNOSIS — Z6839 Body mass index (BMI) 39.0-39.9, adult: Secondary | ICD-10-CM | POA: Diagnosis not present

## 2017-09-05 DIAGNOSIS — R197 Diarrhea, unspecified: Secondary | ICD-10-CM

## 2017-09-05 DIAGNOSIS — R1032 Left lower quadrant pain: Secondary | ICD-10-CM

## 2017-09-05 MED ORDER — DICYCLOMINE HCL 10 MG PO CAPS: 10.0000 mg | ORAL_CAPSULE | Freq: Three times a day (TID) | ORAL | 0 refills | Status: DC

## 2017-09-05 NOTE — Telephone Encounter (Signed)
Left message for MR. Jeffrey Cohen to come for labs

## 2017-09-05 NOTE — Telephone Encounter (Signed)
Please call patient  _____ Ordered alpha-gal syndrome testing, even though he said he had no known tick bites to ensure he has not developed mammalian meat allergy from tick borne disease. He can come in at any time lab is available for lab only appointment.

## 2017-09-05 NOTE — Progress Notes (Addendum)
Name: Jeffrey DibblesWilliam Cohen   MRN: [id number]    DOB: 11/11/1975   Date:09/05/2017       Progress Note  Subjective  Chief Complaint  Chief Complaint  Patient presents with  . Abdominal Pain    LLQ, diarrhea after eating    HPI  Patient states 7 days ago had steak hibachi was walking around a store and 1-2 hours later had sudden abdomen pain and then had diarrhea and pain was relieved. States Sunday had a t-bone 1-2 hours later had similar pain- followed by diarrhea which relieved pain again. States family had same food and didn't have any incidents. Has never had this before- has lactose intolerance and has bloating and diarrhea but not to the same extent. Denies blood in stools, states usually has normal stools. Endorses mild nausea with severe pain no vomiting.    Mother died earlier this year, increased work stress, wife had a car wreck. Mom had chron's. No tick bites.    Patient Active Problem List   Diagnosis Date Noted  . Chronic venous insufficiency 01/31/2017  . Medication monitoring encounter 10/18/2016  . Dyslipidemia, goal LDL below 130 10/18/2016  . Leg mass, left 10/18/2016  . Acanthosis nigricans 04/14/2016  . Preventative health care 04/14/2016  . Eczema 04/14/2016  . 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 Pain in limb 12/24/2015  . Essential hypertension, benign 03/02/2015  . Obesity 03/02/2015  . Varicose veins of both lower extremities with complications 02/24/2015    Past Medical History:  Diagnosis Date  . Asthma   . Essential hypertension, benign 03/02/2015  . Obesity 03/02/2015    Past Surgical History:  Procedure Laterality Date  . TENDON REPAIR     left thumb  . VEIN LIGATION AND STRIPPING Bilateral 10/29/2015   [id number]    Social History   Tobacco Use  . Smoking status: Never Smoker  . Smokeless tobacco: Never Used  Substance Use Topics  . Alcohol use: Yes    Comment: occasional     Current Outpatient Medications:  .  losartan (COZAAR) 100 MG tablet,  Take 1 tablet (100 mg total) by mouth daily., Disp: 90 tablet, Rfl: 3 .  albuterol (PROVENTIL HFA) 108 (90 Base) MCG/ACT inhaler, Inhale 2 puffs into the lungs every 6 (six) hours as needed for wheezing or shortness of breath. (Patient not taking: Reported on 09/05/2017), Disp: 1 Inhaler, Rfl: 1  No Known Allergies  ROS   Constitutional: Negative for fever or weight change.  Respiratory: Negative for cough and shortness of breath.   Cardiovascular: Negative for chest pain or palpitations.  Gastrointestinal: Positive for abdominal pain, no bowel changes.  Musculoskeletal: Negative for gait problem or joint swelling.  Skin: Negative for rash.  Neurological: Negative for dizziness or headache.  No other specific complaints in a complete review of systems (except as listed in HPI above).  Objective  Vitals:   09/05/17 0831  BP: 126/86  Pulse: 82  Resp: 18  Temp: 98 F (36.7 C)  TempSrc: Oral  SpO2: 96%  Weight: 299 lb 11.2 oz (135.9 kg)  Height: 6\' 1"  (1.854 m)     Body mass index is 39.54 kg/m.  Nursing Note and Vital Signs reviewed.  Physical Exam  Constitutional: Patient appears well-developed and well-nourished. Obese  No distress.  Cardiovascular: Normal rate, regular rhythm, S1/S2 present. Pulses intact.  Pulmonary/Chest: Effort normal and breath sounds clear.  Abdominal: Soft and non-tender, bowel sounds present, no masses or lesions palpated. Psychiatric: Patient has a normal mood and  affect. behavior is normal. Judgment and thought content normal.  No results found for this or any previous visit (from the past 72 hour(s)).  Assessment & Plan  1. Abdominal pain, acute, left lower quadrant Top differentials: food poisoning, IBS, alpha gal syndrome; less likely  Food poisoning as no one else had similar symptoms, discussed following IBS diet follow up in one month. Order put in for alpha gal testing- will call pt to come in to get it. Discussed weight loss.  -  dicyclomine (BENTYL) 10 MG capsule; Take 1 capsule (10 mg total) by mouth 4 (four) times daily -  before meals and at bedtime.  Dispense: 30 capsule; Refill: 0 - Alpha-Gal Panel; Future  2. Diarrhea, unspecified type  - Alpha-Gal Panel; Future  3. Morbid obesity due to excess calories (HCC) Small frequent meals, healthy snacks, increase water intake, stress reduction BMi greater than 35; plus HTN and hyperlipidemia  4. BMI 39.0-39.9,adult See above    -Red flags and when to present for emergency care or RTC including fever >101.46F, chest pain, shortness of breath, new/worsening/un-resolving symptoms, anaphylaxis reviewed with patient at time of visit. Follow up and care instructions discussed and provided in AVS.  ----------------------------------- I have reviewed this encounter including the documentation in this note and/or discussed this patient with the provider, Sharyon Cable DNP AGNP-C. I am certifying that I agree with the content of this note as supervising physician. Baruch Gouty, MD [facility] 09/05/2017, 1:21 PM

## 2017-09-05 NOTE — Patient Instructions (Addendum)
-   GO shopping for healthy snacks - 64 ounces of water a day   Diet for Irritable Bowel Syndrome When you have irritable bowel syndrome (IBS), the foods you eat and your eating habits are very important. IBS may cause various symptoms, such as abdominal pain, constipation, or diarrhea. Choosing the right foods can help ease discomfort caused by these symptoms. Work with your health care provider and dietitian to find the best eating plan to help control your symptoms. What general guidelines do I need to follow?  Keep a food diary. This will help you identify foods that cause symptoms. Write down: ? What you eat and when. ? What symptoms you have. ? When symptoms occur in relation to your meals.  Avoid foods that cause symptoms. Talk with your dietitian about other ways to get the same nutrients that are in these foods.  Eat more foods that contain fiber. Take a fiber supplement if directed by your dietitian.  Eat your meals slowly, in a relaxed setting.  Aim to eat 5-6 small meals per day. Do not skip meals.  Drink enough fluids to keep your urine clear or pale yellow.  Ask your health care provider if you should take an over-the-counter probiotic during flare-ups to help restore healthy gut bacteria.  If you have cramping or diarrhea, try making your meals low in fat and high in carbohydrates. Examples of carbohydrates are pasta, rice, whole grain breads and cereals, fruits, and vegetables.  If dairy products cause your symptoms to flare up, try eating less of them. You might be able to handle yogurt better than other dairy products because it contains bacteria that help with digestion. What foods are not recommended? The following are some foods and drinks that may worsen your symptoms:  Fatty foods, such as [place] fries.  Milk products, such as cheese or ice cream.  Chocolate.  Alcohol.  Products with caffeine, such as coffee.  Carbonated drinks, such as soda.  The items  listed above may not be a complete list of foods and beverages to avoid. Contact your dietitian for more information. What foods are good sources of fiber? Your health care provider or dietitian may recommend that you eat more foods that contain fiber. Fiber can help reduce constipation and other IBS symptoms. Add foods with fiber to your diet a little at a time so that your body can get used to them. Too much fiber at once might cause gas and swelling of your abdomen. The following are some foods that are good sources of fiber:  Apples.  Peaches.  Pears.  Berries.  Figs.  Broccoli (raw).  Cabbage.  Carrots.  Raw peas.  Kidney beans.  Lima beans.  Whole grain bread.  Whole grain cereal.  Where to find more information: [employer] for Functional Gastrointestinal Disorders: www.iffgd.[employer] National Institute of Diabetes and Digestive and Kidney Diseases: http://norris-lawson.com/www.niddk.nih.gov/health-information/health-topics/digestive-diseases/ibs/Pages/facts.aspx This information is not intended to replace advice given to you by your health care provider. Make sure you discuss any questions you have with your health care provider. Document Released: 05/27/2003 Document Revised: 08/12/2015 Document Reviewed: 06/06/2013 Elsevier Interactive Patient Education  2018 [employer]

## 2017-09-11 ENCOUNTER — Other Ambulatory Visit: Payer: Self-pay

## 2017-09-11 DIAGNOSIS — E785 Hyperlipidemia, unspecified: Secondary | ICD-10-CM

## 2017-09-11 DIAGNOSIS — R7309 Other abnormal glucose: Secondary | ICD-10-CM

## 2017-09-11 DIAGNOSIS — R197 Diarrhea, unspecified: Secondary | ICD-10-CM

## 2017-09-11 DIAGNOSIS — L83 Acanthosis nigricans: Secondary | ICD-10-CM

## 2017-09-11 DIAGNOSIS — R1032 Left lower quadrant pain: Secondary | ICD-10-CM

## 2017-09-14 LAB — HEMOGLOBIN A1C
HEMOGLOBIN A1C: 5.3 %{Hb} (ref <5.7)
MEAN PLASMA GLUCOSE: 105 (calc)
eAG (mmol/L): 5.8 (calc)

## 2017-09-14 LAB — ALPHA-GAL PANEL
Beef IgE: 0.13 kU/L (ref <0.35)
CLASS: 0
LAMB/MUTTON IGE: <0.1 kU/L (ref <0.35)
Pork IgE: 0.12 kU/L (ref <0.35)

## 2017-09-14 LAB — LIPID PANEL
CHOLESTEROL: 228 mg/dL — AB (ref <200)
HDL: 52 mg/dL (ref >40)
LDL Cholesterol (Calc): 151 mg/dL (calc) — ABNORMAL HIGH
Non-HDL Cholesterol (Calc): 176 mg/dL (calc) — ABNORMAL HIGH (ref <130)
Total CHOL/HDL Ratio: 4.4 (calc) (ref <5.0)
Triglycerides: 126 mg/dL (ref <150)

## 2017-09-14 LAB — BASIC METABOLIC PANEL
BUN: 13 mg/dL (ref 7–25)
CHLORIDE: 100 mmol/L (ref 98–110)
CO2: 31 mmol/L (ref 20–32)
Calcium: 9.5 mg/dL (ref 8.6–10.3)
Creat: 1.12 mg/dL (ref 0.60–1.35)
Glucose, Bld: 88 mg/dL (ref 65–139)
Potassium: 4.2 mmol/L (ref 3.5–5.3)
SODIUM: 138 mmol/L (ref 135–146)

## 2017-10-05 ENCOUNTER — Ambulatory Visit: Payer: BC Managed Care – PPO | Admitting: Family Medicine

## 2017-12-18 ENCOUNTER — Encounter: Payer: Self-pay | Admitting: Family Medicine

## 2017-12-18 ENCOUNTER — Ambulatory Visit: Payer: BC Managed Care – PPO | Admitting: Family Medicine

## 2017-12-18 VITALS — BP 122/80 | HR 99 | Temp 98.5°F | Ht 73.0 in | Wt 303.5 lb

## 2017-12-18 DIAGNOSIS — J029 Acute pharyngitis, unspecified: Secondary | ICD-10-CM

## 2017-12-18 DIAGNOSIS — I10 Essential (primary) hypertension: Secondary | ICD-10-CM

## 2017-12-18 LAB — POCT RAPID STREP A (OFFICE): RAPID STREP A SCREEN: NEGATIVE

## 2017-12-18 MED ORDER — BENZONATATE 100 MG PO CAPS: 100.0000 mg | ORAL_CAPSULE | Freq: Three times a day (TID) | ORAL | 0 refills | Status: DC | PRN

## 2017-12-18 MED ORDER — LOSARTAN POTASSIUM-HCTZ 50-12.5 MG PO TABS: 1.0000 | ORAL_TABLET | Freq: Every day | ORAL | 3 refills | Status: DC

## 2017-12-18 NOTE — Patient Instructions (Signed)

## 2017-12-18 NOTE — Progress Notes (Signed)
BP 122/80   Pulse 99   Temp 98.5 F (36.9 C) (Oral)   Ht 6\' 1"  (1.854 m)   Wt (!) 303 lb 8 oz (137.7 kg)   SpO2 98%   BMI 40.04 kg/m    Subjective:    Patient ID: Jeffrey Cohen, male    DOB: 24-Feb-1976, 42 y.o.   MRN: [id number]  HPI: Jeffrey Cohen is a 42 y.o. male  Chief Complaint  Patient presents with  . URI    onset 3 days ago, symptoms include cough and congestion  . Hypertension    discuss bp med    HPI Here for a sick visit; went on vacation and came back sick on the trip back; [place]; coughing up everything under the sun; no fevers; few chills; sinuses bothered him the first day; ears are okay; no rash; sore throat at first; better now  Hypertension; he feels like he is retaining water; up 10 pounds over the last few months; no SHOB like heart failure; wants to go back to HCTZ  Depression screen [facility] 2/9 12/18/2017 12/18/2017 07/24/2017 04/26/2017 12/18/2016  Decreased Interest 0 0 0 0 0  Down, Depressed, Hopeless 0 0 1 0 0  PHQ - 2 Score 0 0 1 0 0  Altered sleeping 0 - - - -  Tired, decreased energy 0 - - - -  Change in appetite 0 - - - -  Feeling bad or failure about yourself  0 - - - -  Trouble concentrating 0 - - - -  Moving slowly or fidgety/restless 0 - - - -  Suicidal thoughts 0 - - - -  PHQ-9 Score 0 - - - -  Difficult doing work/chores Not difficult at all - - - -   Fall Risk  12/18/2017 07/24/2017 04/26/2017 12/18/2016 10/18/2016  Falls in the past year? No No No Yes Yes  Number falls in past yr: - - - 1 1  Injury with Fall? - - - No No  Comment - - - - -    Relevant past medical, surgical, family and social history reviewed Past Medical History:  Diagnosis Date  . Asthma   . Essential hypertension, benign 03/02/2015  . Obesity 03/02/2015   Past Surgical History:  Procedure Laterality Date  . TENDON REPAIR     left thumb  . VEIN LIGATION AND STRIPPING Bilateral 10/29/2015   [id number]   Family History  Problem Relation Age of Onset  . Cancer  Mother        liver  . Emphysema Mother   . Hypertension Mother   . Crohn's disease Mother   . Hernia Mother   . Diabetes Maternal Grandfather   . Stroke Maternal Grandmother   . Asthma Daughter   . Heart disease Neg Hx   . COPD Neg Hx    Social History   Tobacco Use  . Smoking status: Never Smoker  . Smokeless tobacco: Never Used  Substance Use Topics  . Alcohol use: Yes    Comment: occasional  . Drug use: No     Office Visit from 12/18/2017 in [facility]  AUDIT-C Score  1      Interim medical history since last visit reviewed. Allergies and medications reviewed  Review of Systems Per HPI unless specifically indicated above     Objective:    BP 122/80   Pulse 99   Temp 98.5 F (36.9 C) (Oral)   Ht 6\' 1"  (1.854 m)  Wt (!) 303 lb 8 oz (137.7 kg)   SpO2 98%   BMI 40.04 kg/m   Wt Readings from Last 3 Encounters:  12/18/17 (!) 303 lb 8 oz (137.7 kg)  09/05/17 299 lb 11.2 oz (135.9 kg)  07/24/17 293 lb 11.2 oz (133.2 kg)    Physical Exam  Constitutional: He appears well-developed and well-nourished. No distress.  HENT:  Mouth/Throat: Posterior oropharyngeal erythema present. No oropharyngeal exudate or posterior oropharyngeal edema.  Eyes: No scleral icterus.  Cardiovascular: Normal rate and regular rhythm.  Pulmonary/Chest: Effort normal and breath sounds normal.  Lymphadenopathy:    He has cervical adenopathy (shoddy).  Neurological: He is alert.  Skin: No pallor.  Psychiatric: He has a normal mood and affect.    Rapid strep: negative     Assessment & Plan:   Problem List Items Addressed This Visit      Cardiovascular and Mediastinum   Essential hypertension, benign (Chronic)    Will start back on HCTZ; see AVS; return for close f/u in a few weeks; labs to monitor Mg2+ and K+ if cramps       Other Visit Diagnoses    Pharyngitis, unspecified etiology    -  Primary   rapid strep negative; cultur epending, but think this is  viral; rest, hydration, supportive care   Relevant Orders   POCT rapid strep A (Completed)   Culture, Group A Strep (Completed)       Follow up plan: Return in about 3 weeks (around 01/08/2018) for follow-up visit with Dr. Sherie Don.  An after-visit summary was printed and given to the patient at check-out.  Please see the patient instructions which may contain other information and recommendations beyond what is mentioned above in the assessment and plan.  Meds ordered this encounter  Medications  . benzonatate (TESSALON PERLES) 100 MG capsule    Sig: Take 1 capsule (100 mg total) by mouth every 8 (eight) hours as needed for cough.    Dispense:  30 capsule    Refill:  0  . DISCONTD: losartan-hydrochlorothiazide (HYZAAR) 50-12.5 MG tablet    Sig: Take 1 tablet by mouth daily.    Dispense:  90 tablet    Refill:  3    Stopping 100 mg plain losartan    Orders Placed This Encounter  Procedures  . Culture, Group A Strep  . POCT rapid strep A

## 2017-12-20 LAB — CULTURE, GROUP A STREP
MICRO NUMBER:: 91179954
SPECIMEN QUALITY: ADEQUATE

## 2017-12-26 ENCOUNTER — Telehealth: Payer: Self-pay | Admitting: Family Medicine

## 2017-12-26 MED ORDER — HYDROCHLOROTHIAZIDE 12.5 MG PO CAPS: 12.5000 mg | ORAL_CAPSULE | Freq: Every day | ORAL | 3 refills | Status: DC

## 2017-12-26 MED ORDER — LOSARTAN POTASSIUM 50 MG PO TABS: 50.0000 mg | ORAL_TABLET | Freq: Every day | ORAL | 3 refills | Status: DC

## 2017-12-26 NOTE — Telephone Encounter (Signed)
That's fine to split them up

## 2017-12-26 NOTE — Telephone Encounter (Signed)
Pharmacy called. They had already split prescription up.

## 2017-12-26 NOTE — Telephone Encounter (Signed)
Copied from CRM [id number]. Topic: Quick Communication - Rx Refill/Question >> Dec 26, 2017  1:38 PM Darletta Moll L wrote: Medication: losartan-hydrochlorothiazide (HYZAAR) 50-12.5 MG tablet  (pharmacy needs to know if it's ok to separate the script because its on backorder?)  Has the patient contacted their pharmacy? Yes.   (Agent: If no, request that the patient contact the pharmacy for the refill.) (Agent: If yes, when and what did the pharmacy advise?)  Preferred Pharmacy (with phone number or street name): [facility] [address] [place] - [address] [place] [address] Phone: [phone] Fax: [phone]  Agent: Please be advised that RX refills may take up to 3 business days. We ask that you follow-up with your pharmacy.

## 2017-12-31 NOTE — Assessment & Plan Note (Signed)
Will start back on HCTZ; see AVS; return for close f/u in a few weeks; labs to monitor Mg2+ and K+ if cramps

## 2018-01-01 ENCOUNTER — Encounter: Payer: Self-pay | Admitting: Nurse Practitioner

## 2018-01-01 ENCOUNTER — Ambulatory Visit: Payer: BC Managed Care – PPO | Admitting: Nurse Practitioner

## 2018-01-01 ENCOUNTER — Ambulatory Visit
Admission: RE | Admit: 2018-01-01 | Discharge: 2018-01-01 | Disposition: A | Discharge status: Home / Self Care | Payer: BC Managed Care – PPO | Source: Ambulatory Visit | Attending: Nurse Practitioner | Admitting: Nurse Practitioner

## 2018-01-01 ENCOUNTER — Other Ambulatory Visit: Payer: Self-pay | Admitting: Nurse Practitioner

## 2018-01-01 VITALS — BP 122/84 | HR 90 | Temp 98.3°F | Resp 18 | Ht 73.0 in | Wt 311.8 lb

## 2018-01-01 DIAGNOSIS — R0602 Shortness of breath: Secondary | ICD-10-CM | POA: Insufficient documentation

## 2018-01-01 DIAGNOSIS — R0689 Other abnormalities of breathing: Secondary | ICD-10-CM

## 2018-01-01 DIAGNOSIS — J9811 Atelectasis: Secondary | ICD-10-CM | POA: Diagnosis not present

## 2018-01-01 DIAGNOSIS — J189 Pneumonia, unspecified organism: Secondary | ICD-10-CM

## 2018-01-01 DIAGNOSIS — R05 Cough: Secondary | ICD-10-CM | POA: Diagnosis not present

## 2018-01-01 DIAGNOSIS — R059 Cough, unspecified: Secondary | ICD-10-CM

## 2018-01-01 MED ORDER — DOXYCYCLINE HYCLATE 100 MG PO TABS: 100.0000 mg | ORAL_TABLET | Freq: Two times a day (BID) | ORAL | 0 refills | Status: DC

## 2018-01-01 MED ORDER — PROMETHAZINE-DM 6.25-15 MG/5ML PO SYRP: 2.5000 mL | ORAL_SOLUTION | Freq: Every evening | ORAL | 0 refills | Status: DC | PRN

## 2018-01-01 MED ORDER — ALBUTEROL SULFATE HFA 108 (90 BASE) MCG/ACT IN AERS
2.0000 | INHALATION_SPRAY | Freq: Four times a day (QID) | RESPIRATORY_TRACT | 0 refills | Status: DC | PRN
Start: 2018-01-01 — End: 2019-04-08

## 2018-01-01 NOTE — Progress Notes (Signed)
Name: Jeffrey Cohen   MRN: [id number]    DOB: 02-20-1976   Date:01/01/2018       Progress Note  Subjective  Chief Complaint  Chief Complaint  Patient presents with  . URI    onset 3 weeks, already seen Dr. Sherie Don, symptoms include: cough SOB and chest/nasal congestion    HPI  Patient presents for cough ongoing for greater than 3 weeks, given tessalon perls on visit on 12/18/2017 with PCP. States he gets coughing fits and is coughing up thick phelgm- yellowish and gets lighter as the day progresses.  Does not smoke. Medications helps minimally. Gets shortness of breath with exertion or when laying down. Has chest pain when coughing. Took some nyquil with some relief.  History of asthma. Denies fevers or chills. Mild sore throat, some nasal congestion, no ear pain  Has albuterol inhaler uses it about once a day; tries not to use it often.   Patient Active Problem List   Diagnosis Date Noted  . Chronic venous insufficiency 01/31/2017  . Medication monitoring encounter 10/18/2016  . Dyslipidemia, goal LDL below 130 10/18/2016  . Leg mass, left 10/18/2016  . Acanthosis nigricans 04/14/2016  . Preventative health care 04/14/2016  . Eczema 04/14/2016  . 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 Pain in limb 12/24/2015  . Essential hypertension, benign 03/02/2015  . Obesity 03/02/2015  . Varicose veins of both lower extremities with complications 02/24/2015    Past Medical History:  Diagnosis Date  . Asthma   . Essential hypertension, benign 03/02/2015  . Obesity 03/02/2015    Past Surgical History:  Procedure Laterality Date  . TENDON REPAIR     left thumb  . VEIN LIGATION AND STRIPPING Bilateral 10/29/2015   [id number]    Social History   Tobacco Use  . Smoking status: Never Smoker  . Smokeless tobacco: Never Used  Substance Use Topics  . Alcohol use: Yes    Comment: occasional     Current Outpatient Medications:  .  benzonatate (TESSALON PERLES) 100 MG capsule, Take 1 capsule  (100 mg total) by mouth every 8 (eight) hours as needed for cough., Disp: 30 capsule, Rfl: 0 .  hydrochlorothiazide (MICROZIDE) 12.5 MG capsule, Take 1 capsule (12.5 mg total) by mouth daily., Disp: 90 capsule, Rfl: 3 .  losartan (COZAAR) 50 MG tablet, Take 1 tablet (50 mg total) by mouth daily., Disp: 90 tablet, Rfl: 3  No Known Allergies  ROS    No other specific complaints in a complete review of systems (except as listed in HPI above).  Objective  Vitals:   01/01/18 1041  BP: 122/84  Pulse: 90  Temp: 98.3 F (36.8 C)  TempSrc: Oral  SpO2: 93%  Weight: (!) 311 lb 12.8 oz (141.4 kg)  Height: 6\' 1"  (1.854 m)    Body mass index is 41.14 kg/m.  Nursing Note and Vital Signs reviewed.  Physical Exam  Constitutional: He is oriented to person, place, and time. He appears well-developed and well-nourished. No distress.  HENT:  Head: Normocephalic and atraumatic.  Right Ear: Hearing, tympanic membrane, external ear and ear canal normal.  Left Ear: Hearing, tympanic membrane, external ear and ear canal normal.  Nose: Mucosal edema present. Right sinus exhibits no maxillary sinus tenderness and no frontal sinus tenderness. Left sinus exhibits no maxillary sinus tenderness and no frontal sinus tenderness.  Mouth/Throat: Uvula is midline, oropharynx is clear and moist and mucous membranes are normal. No oropharyngeal exudate.  Eyes: Pupils are equal, round, and reactive to  light. Conjunctivae and EOM are normal.  Cardiovascular: Normal heart sounds and intact distal pulses.  Pulmonary/Chest: He has decreased breath sounds in the left upper field. He has wheezes in the right upper field, the right middle field, the right lower field, the left upper field and the left lower field. He has rhonchi in the left upper field. He exhibits tenderness. He exhibits no edema, no deformity and no swelling.  Musculoskeletal: Normal range of motion.  Lymphadenopathy:    He has no cervical  adenopathy.  Neurological: He is alert and oriented to person, place, and time.  Skin: Skin is warm and dry.  Psychiatric: He has a normal mood and affect. His behavior is normal. Judgment and thought content normal.      No results found for this or any previous visit (from the past 48 hour(s)).  Assessment & Plan  1. Adventitious breath sounds - DG Chest 2 View; Future - albuterol (PROVENTIL HFA;VENTOLIN HFA) 108 (90 Base) MCG/ACT inhaler; Inhale 2 puffs into the lungs every 6 (six) hours as needed for wheezing or shortness of breath.  Dispense: 1 Inhaler; Refill: 0  2. Shortness of breath Discussed deep breathing - DG Chest 2 View; Future - albuterol (PROVENTIL HFA;VENTOLIN HFA) 108 (90 Base) MCG/ACT inhaler; Inhale 2 puffs into the lungs every 6 (six) hours as needed for wheezing or shortness of breath.  Dispense: 1 Inhaler; Refill: 0  3. Cough Discussed OTC management  - DG Chest 2 View; Future - promethazine-dextromethorphan (PROMETHAZINE-DM) 6.25-15 MG/5ML syrup; Take 2.5 mLs by mouth at bedtime as needed for cough.  Dispense: 118 mL; Refill: 0  Work note for today and tomorrow given

## 2018-01-01 NOTE — Patient Instructions (Addendum)
-   Try using coupon from goodrx.com or can use app - Please drink plenty of water ( at least 64 ounces a day), rest, use a humidifier - Take guaifenesin (musinex) every 12 hours for at least 3-4 days - Take cough syrup as needed at night time  - Please use albuterol inhaler for chest tightness and shortness of breath, 2 puffs every 4-6 hours.

## 2018-01-15 ENCOUNTER — Telehealth: Payer: Self-pay

## 2018-01-15 NOTE — Telephone Encounter (Signed)
Pt wife called states his coughing stopped but has returned

## 2018-01-15 NOTE — Telephone Encounter (Signed)
Consider taking daily antihistamine such as cetirizine or loratadine addition to his cough medicine. If his cough is not improved in one week we may have to consider other causes such as his losartan or acid reflux.

## 2018-01-16 ENCOUNTER — Other Ambulatory Visit: Payer: Self-pay | Admitting: Nurse Practitioner

## 2018-01-16 NOTE — Telephone Encounter (Signed)
Left detailed voicemial 

## 2018-08-26 ENCOUNTER — Ambulatory Visit: Payer: BC Managed Care – PPO | Admitting: Podiatry

## 2018-08-26 ENCOUNTER — Encounter: Payer: Self-pay | Admitting: Podiatry

## 2018-08-26 ENCOUNTER — Other Ambulatory Visit: Payer: Self-pay

## 2018-08-26 ENCOUNTER — Ambulatory Visit: Payer: BC Managed Care – PPO

## 2018-08-26 VITALS — BP 141/85 | HR 78 | Temp 96.8°F | Resp 16

## 2018-08-26 DIAGNOSIS — M2141 Flat foot [pes planus] (acquired), right foot: Secondary | ICD-10-CM

## 2018-08-26 DIAGNOSIS — Q828 Other specified congenital malformations of skin: Secondary | ICD-10-CM

## 2018-08-26 DIAGNOSIS — M2142 Flat foot [pes planus] (acquired), left foot: Secondary | ICD-10-CM

## 2018-08-26 NOTE — Progress Notes (Signed)
  Subjective:  Patient ID: Michah Minton, male    DOB: 06/12/1975,  MRN: [id number] HPI Chief Complaint  Patient presents with  . Callouses    Plantar/lateral bilateral (L>R) - tiny, callused lesions x 2-3 years, used to get pedicures regularly to have trimmed, now just doing it at home, sore when walking  . New Patient (Initial Visit)    43 y.o. male presents with the above complaint.   ROS: Denies fever chills nausea vomiting muscle aches pains calf pain back pain chest pain shortness of breath.  Past Medical History:  Diagnosis Date  . Asthma   . Essential hypertension, benign 03/02/2015  . Obesity 03/02/2015   Past Surgical History:  Procedure Laterality Date  . TENDON REPAIR     left thumb  . VEIN LIGATION AND STRIPPING Bilateral 10/29/2015   [id number]    Current Outpatient Medications:  .  albuterol (PROVENTIL HFA;VENTOLIN HFA) 108 (90 Base) MCG/ACT inhaler, Inhale 2 puffs into the lungs every 6 (six) hours as needed for wheezing or shortness of breath., Disp: 1 Inhaler, Rfl: 0 .  hydrochlorothiazide (MICROZIDE) 12.5 MG capsule, Take 1 capsule (12.5 mg total) by mouth daily., Disp: 90 capsule, Rfl: 3 .  losartan (COZAAR) 50 MG tablet, Take 1 tablet (50 mg total) by mouth daily., Disp: 90 tablet, Rfl: 3  No Known Allergies Review of Systems Objective:   Vitals:   08/26/18 1623  BP: (!) 141/85  Pulse: 78  Resp: 16  Temp: (!) 96.8 F (36 C)    General: Well developed, nourished, in no acute distress, alert and oriented x3   Dermatological: Skin is warm, dry and supple bilateral. Nails x 10 are well maintained; remaining integument appears unremarkable at this time. There are no open sores, no preulcerative lesions, no rash or signs of infection present.  Porokeratotic lesions plantar lateral aspect of the bilateral foot no open lesions or wounds.  Vascular: Dorsalis Pedis artery and Posterior Tibial artery pedal pulses are 2/4 bilateral with immedate capillary fill  time. Pedal hair growth present. No varicosities and no lower extremity edema present bilateral.   Neruologic: Grossly intact via light touch bilateral. Vibratory intact via tuning fork bilateral. Protective threshold with Semmes Wienstein monofilament intact to all pedal sites bilateral. Patellar and Achilles deep tendon reflexes 2+ bilateral. No Babinski or clonus noted bilateral.   Musculoskeletal: No gross boney pedal deformities bilateral. No pain, crepitus, or limitation noted with foot and ankle range of motion bilateral. Muscular strength 5/5 in all groups tested bilateral.  Gait: Unassisted, Nonantalgic.    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     Cathe Bilger T. [place], [place]

## 2018-09-11 ENCOUNTER — Ambulatory Visit (INDEPENDENT_AMBULATORY_CARE_PROVIDER_SITE_OTHER): Payer: BC Managed Care – PPO | Admitting: Orthotics

## 2018-09-11 ENCOUNTER — Other Ambulatory Visit: Payer: Self-pay

## 2018-09-11 DIAGNOSIS — Q828 Other specified congenital malformations of skin: Secondary | ICD-10-CM

## 2018-09-11 NOTE — Progress Notes (Signed)
Patient came into today to be cast for [employer]. Upon recommendation of Dr. Milinda Pointer Patient presents with porokeratosis Goals are off loading painful keratomas Plan vendor [facility]

## 2018-10-02 ENCOUNTER — Other Ambulatory Visit: Payer: BC Managed Care – PPO | Admitting: Orthotics

## 2018-10-09 ENCOUNTER — Ambulatory Visit: Payer: BC Managed Care – PPO | Admitting: Podiatry

## 2018-10-09 ENCOUNTER — Encounter: Payer: Self-pay | Admitting: Podiatry

## 2018-10-09 ENCOUNTER — Other Ambulatory Visit: Payer: Self-pay

## 2018-10-09 VITALS — Temp 96.1°F

## 2018-10-09 DIAGNOSIS — Q828 Other specified congenital malformations of skin: Secondary | ICD-10-CM | POA: Diagnosis not present

## 2018-10-09 NOTE — Progress Notes (Signed)
He presents today for follow-up of his poor keratoma states that they are doing much better as he refers to the lateral aspect the left foot.  Objective: Vital signs are stable alert and oriented x3.  There is no erythema edema cellulitis drainage odor solitary poor keratomas to on the lateral aspect of the left foot plantarly.  No open lesions or wounds.  Assessment: Porokeratosis x2 lesions left.  Plan: Enucleation of these lesions today with chemical destruction of the lesion Cantharone under occlusion to be washed off thoroughly tomorrow.  Follow-up with him in 2 months

## 2018-12-04 ENCOUNTER — Ambulatory Visit: Payer: BC Managed Care – PPO | Admitting: Podiatry

## 2018-12-25 ENCOUNTER — Other Ambulatory Visit: Payer: Self-pay

## 2018-12-25 ENCOUNTER — Encounter: Payer: Self-pay | Admitting: Podiatry

## 2018-12-25 ENCOUNTER — Ambulatory Visit: Payer: BC Managed Care – PPO | Admitting: Podiatry

## 2018-12-25 DIAGNOSIS — Q828 Other specified congenital malformations of skin: Secondary | ICD-10-CM

## 2018-12-25 NOTE — Progress Notes (Signed)
He presents today for follow-up of porokeratosis x2 left foot.  States that they feel fine really have not seen him come back.  Objective: Vital signs are stable he is alert and oriented x3.  Pulses are palpable.  2 small porokeratotic lesions plantar aspect of the left foot are present though they are much smaller than previously noted.  No open lesions or wounds are noted.  Assessment: Well-healing porokeratotic lesions.  Plan: I debrided those lesions once again today we will follow-up with him on an as-needed basis.

## 2019-04-04 ENCOUNTER — Telehealth: Payer: Self-pay | Admitting: Family Medicine

## 2019-04-04 ENCOUNTER — Other Ambulatory Visit: Payer: Self-pay | Admitting: Family Medicine

## 2019-04-04 NOTE — Telephone Encounter (Signed)
Pt has not been seen in over a yr and needs a follow-up for refills

## 2019-04-04 NOTE — Telephone Encounter (Signed)
Medication Refill - Medication: losartan (COZAAR) 50 MG tablet   Has the patient contacted their pharmacy? Yes.   (Agent: If no, request that the patient contact the pharmacy for the refill.) (Agent: If yes, when and what did the pharmacy advise?)  Preferred Pharmacy (with phone number or street name):  [id number] - [place], [place] - [address] Phone:  [phone]  Fax:  [phone]       Agent: Please be advised that RX refills may take up to 3 business days. We ask that you follow-up with your pharmacy.

## 2019-04-04 NOTE — Telephone Encounter (Signed)
Appt on Wed the 20th at 11:20

## 2019-04-07 MED ORDER — LOSARTAN POTASSIUM 50 MG PO TABS: 50.0000 mg | ORAL_TABLET | Freq: Every day | ORAL | 0 refills | Status: DC

## 2019-04-08 ENCOUNTER — Other Ambulatory Visit: Payer: Self-pay

## 2019-04-08 ENCOUNTER — Ambulatory Visit: Payer: BC Managed Care – PPO | Admitting: Family Medicine

## 2019-04-08 ENCOUNTER — Encounter: Payer: Self-pay | Admitting: Family Medicine

## 2019-04-08 VITALS — BP 152/80 | HR 86 | Temp 97.3°F | Resp 20 | Ht 73.0 in | Wt 321.7 lb

## 2019-04-08 DIAGNOSIS — R0683 Snoring: Secondary | ICD-10-CM

## 2019-04-08 DIAGNOSIS — J4520 Mild intermittent asthma, uncomplicated: Secondary | ICD-10-CM | POA: Diagnosis not present

## 2019-04-08 DIAGNOSIS — R29818 Other symptoms and signs involving the nervous system: Secondary | ICD-10-CM

## 2019-04-08 DIAGNOSIS — E785 Hyperlipidemia, unspecified: Secondary | ICD-10-CM

## 2019-04-08 DIAGNOSIS — I10 Essential (primary) hypertension: Secondary | ICD-10-CM

## 2019-04-08 DIAGNOSIS — Z5181 Encounter for therapeutic drug level monitoring: Secondary | ICD-10-CM

## 2019-04-08 DIAGNOSIS — Z6841 Body Mass Index (BMI) 40.0 and over, adult: Secondary | ICD-10-CM

## 2019-04-08 MED ORDER — LOSARTAN POTASSIUM-HCTZ 100-12.5 MG PO TABS: 1.0000 | ORAL_TABLET | Freq: Every day | ORAL | 3 refills | Status: DC

## 2019-04-08 MED ORDER — ALBUTEROL SULFATE HFA 108 (90 BASE) MCG/ACT IN AERS: 2.0000 | INHALATION_SPRAY | Freq: Four times a day (QID) | RESPIRATORY_TRACT | 2 refills | Status: DC | PRN

## 2019-04-08 NOTE — Patient Instructions (Signed)
Goal BP 130/80 Check 2x a day and write it down over the next couple weeks <140/90  Would be progress, call me sooner than the one month follow up if BP is >150/100 or you're having headaches chest pain/pressure, feel like you're gonna pass out

## 2019-04-08 NOTE — Progress Notes (Signed)
Name: Jeffrey Cohen   MRN: [id number]    DOB: Mar 14, 1976   Date:04/08/2019       Progress Note  Chief Complaint  Patient presents with  . Follow-up  . Hypertension  . Medication Refill  . Snoring    discuss having sleep study     Subjective:   Jeffrey Cohen is a 44 y.o. male, presents to clinic for routine follow up on the conditions listed above.  Pt here for routine f/up but also has noticed weight gain, clothes fitting tighter, hx of snoring but recently getting worse - wife told him a couple months ago its getting really bad, latter, more frequent, keeping her up at night. Pt having a couple of episodes of waking up with HA's and nose bleeds, has some hx of nose bleeds before but this is slightly worse.  He has a history of nosebleeds when he gets upset aggravated or "worked up"  Hypertension:  Pt diagnosed with HTN 5-6 years Currently managed on losartan and HCTZ  Pt reports good med compliance and denies any SE.  No lightheadedness, hypotension, syncope. Blood pressure today is uncontrolled, unstable, seems to be trending up along with weight and progressive snoring/likely OSA sx. BP Readings from Last 3 Encounters:  04/08/19 (!) 152/80  08/26/18 (!) 141/85  01/01/18 122/84  Pt denies CP, SOB, exertional sx, LE edema, palpitation, Ha's, visual disturbances Dietary efforts for BP?  none increased sedentary lifestyle  Hyperlipidemia: Current Medication Regimen:  None - lifestyle/diet Last Lipids: Lab Results  Component Value Date   CHOL 228 (H) 09/11/2017   HDL 52 09/11/2017   LDLCALC 151 (H) 09/11/2017   TRIG 126 09/11/2017   CHOLHDL 4.4 09/11/2017  - Current Diet:  Generally unhealthy - no current specific efforts - Denies: Chest pain, shortness of breath, myalgias. - Documented aortic atherosclerosis? No - Risk factors for atherosclerosis: hypercholesterolemia and hypertension  Asthma (?) :  Patient states he has had some abnormal breath sounds or possible  history of asthma Does continue to use rescue inhaler although it is infrequently   04/08/19 1141  Asthma History  Symptoms 0-2 days/week  Nighttime Awakenings 0-2/month  Asthma interference with normal activity No limitations  SABA use (not for EIB) 0-2 days/wk  Risk: Exacerbations requiring oral systemic steroids 0-1 / year  Asthma Severity Intermittent   Hx of chronic venous insufficiency -he continues to wear compression stockings and have mostly daily symptoms that continue to be fairly constant no worsening although he has had some shooting pain to his left inner leg, all along medial aspect without associated skin changes erythema or change to edema.  He previously saw vascular specialist but has not seen them in the past year or so.     Patient Active Problem List   Diagnosis Date Noted  . Chronic venous insufficiency 01/31/2017  . Medication monitoring encounter 10/18/2016  . Dyslipidemia, goal LDL below 130 10/18/2016  . Acanthosis nigricans 04/14/2016  . Preventative health care 04/14/2016  . Eczema 04/14/2016  . 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 Pain in limb 12/24/2015  . Essential hypertension, benign 03/02/2015  . Obesity 03/02/2015  . Varicose veins of both lower extremities with complications 02/24/2015    Past Surgical History:  Procedure Laterality Date  . TENDON REPAIR     left thumb  . VEIN LIGATION AND STRIPPING Bilateral 10/29/2015   [id number]    Family History  Problem Relation Age of Onset  . Cancer Mother  liver  . Emphysema Mother   . Hypertension Mother   . Crohn's disease Mother   . Hernia Mother   . Diabetes Maternal Grandfather   . Stroke Maternal Grandmother   . Asthma Daughter   . Heart disease Neg Hx   . COPD Neg Hx     Social History   Socioeconomic History  . Marital status: Married    Spouse name: Not on file  . Number of children: Not on file  . Years of education: Not on file  . Highest education level: Not on file   Occupational History  . Not on file  Tobacco Use  . Smoking status: Never Smoker  . Smokeless tobacco: Never Used  Substance and Sexual Activity  . Alcohol use: Yes    Comment: occasional  . Drug use: No  . Sexual activity: Yes  Other Topics Concern  . Not on file  Social History Narrative  . Not on file   Social Determinants of Health   Financial Resource Strain:   . Difficulty of Paying Living Expenses: Not on file  Food Insecurity:   . Worried About Programme researcher, broadcasting/film/video in the Last Year: Not on file  . Ran Out of Food in the Last Year: Not on file  Transportation Needs:   . Lack of Transportation (Medical): Not on file  . Lack of Transportation (Non-Medical): Not on file  Physical Activity:   . Days of Exercise per Week: Not on file  . Minutes of Exercise per Session: Not on file  Stress:   . Feeling of Stress : Not on file  Social Connections:   . Frequency of Communication with Friends and Family: Not on file  . Frequency of Social Gatherings with Friends and Family: Not on file  . Attends Religious Services: Not on file  . Active Member of Clubs or Organizations: Not on file  . Attends Banker Meetings: Not on file  . Marital Status: Not on file  Intimate Partner Violence:   . Fear of Current or Ex-Partner: Not on file  . Emotionally Abused: Not on file  . Physically Abused: Not on file  . Sexually Abused: Not on file     Current Outpatient Medications:  .  albuterol (VENTOLIN HFA) 108 (90 Base) MCG/ACT inhaler, Inhale 2 puffs into the lungs every 6 (six) hours as needed for wheezing or shortness of breath., Disp: 18 g, Rfl: 2 .  losartan-hydrochlorothiazide (HYZAAR) 100-12.5 MG tablet, Take 1 tablet by mouth daily., Disp: 90 tablet, Rfl: 3  No Known Allergies  Chart Review Today: I personally reviewed active problem list, medication list, allergies, family history, social history, health maintenance, notes from last encounter, lab results,  imaging with the patient/caregiver today.   Review of Systems  Constitutional: Negative.   HENT: Negative.   Eyes: Negative.   Respiratory: Negative.   Cardiovascular: Negative.   Gastrointestinal: Negative.   Endocrine: Negative.   Genitourinary: Negative.   Musculoskeletal: Negative.   Skin: Negative.   Allergic/Immunologic: Negative.   Neurological: Negative.   Hematological: Negative.   Psychiatric/Behavioral: Negative.   All other systems reviewed and are negative.    Objective:    Vitals:   04/08/19 1111  BP: (!) 152/80  Pulse: 86  Resp: 20  Temp: (!) 97.3 F (36.3 C)  TempSrc: Temporal  SpO2: 98%  Weight: (!) 321 lb 11.2 oz (145.9 kg)  Height: 6\' 1"  (1.854 m)    Body mass index is 42.44 kg/m.  Physical Exam Vitals and nursing note reviewed.  Constitutional:      General: He is not in acute distress.    Appearance: Normal appearance. He is well-developed. He is not ill-appearing, toxic-appearing or diaphoretic.     Interventions: Face mask in place.  HENT:     Head: Normocephalic and atraumatic.     Jaw: No trismus.     Right Ear: External ear normal.     Left Ear: External ear normal.     Mouth/Throat:     Mouth: Mucous membranes are moist.     Pharynx: Oropharynx is clear. No oropharyngeal exudate or posterior oropharyngeal erythema.  Eyes:     General: Lids are normal. No scleral icterus.    Conjunctiva/sclera: Conjunctivae normal.     Pupils: Pupils are equal, round, and reactive to light.  Neck:     Trachea: Trachea and phonation normal. No tracheal deviation.  Cardiovascular:     Rate and Rhythm: Normal rate and regular rhythm.     Pulses: Normal pulses.          Radial pulses are 2+ on the right side and 2+ on the left side.       Posterior tibial pulses are 2+ on the right side and 2+ on the left side.     Heart sounds: Normal heart sounds. No murmur. No friction rub. No gallop.   Pulmonary:     Effort: Pulmonary effort is normal. No  respiratory distress.     Breath sounds: Normal breath sounds. No stridor. No wheezing, rhonchi or rales.  Abdominal:     General: Bowel sounds are normal. There is no distension.     Palpations: Abdomen is soft.     Tenderness: There is no abdominal tenderness. There is no guarding or rebound.  Musculoskeletal:        General: Normal range of motion.     Cervical back: Normal range of motion and neck supple.     Right lower leg: Edema present.     Left lower leg: Edema present.  Skin:    General: Skin is warm and dry.     Capillary Refill: Capillary refill takes less than 2 seconds.     Coloration: Skin is not jaundiced.     Findings: No rash.     Nails: There is no clubbing.  Neurological:     Mental Status: He is alert.     Cranial Nerves: No dysarthria or facial asymmetry.     Motor: No tremor or abnormal muscle tone.     Gait: Gait normal.  Psychiatric:        Mood and Affect: Mood normal.        Speech: Speech normal.        Behavior: Behavior normal. Behavior is cooperative.       PHQ2/9: Depression screen [facility] 2/9 04/08/2019 12/18/2017 12/18/2017 07/24/2017 04/26/2017  Decreased Interest 0 0 0 0 0  Down, Depressed, Hopeless 1 0 0 1 0  PHQ - 2 Score 1 0 0 1 0  Altered sleeping 1 0 - - -  Tired, decreased energy 1 0 - - -  Change in appetite 1 0 - - -  Feeling bad or failure about yourself  1 0 - - -  Trouble concentrating 0 0 - - -  Moving slowly or fidgety/restless 0 0 - - -  Suicidal thoughts 0 0 - - -  PHQ-9 Score 5 0 - - -  Difficult doing work/chores Not  difficult at all Not difficult at all - - -    phq 9 is positive, reviewed today - positive were mostly related to physical sx secondary to weight care, poor sleep, snoring, less to attributed to mood, will monitor  Fall Risk: Fall Risk  04/08/2019 12/18/2017 07/24/2017 04/26/2017 12/18/2016  Falls in the past year? 0 No No No Yes  Number falls in past yr: 0 - - - 1  Injury with Fall? 0 - - - No  Comment - - - - -     Functional Status Survey: Is the patient deaf or have difficulty hearing?: No Does the patient have difficulty seeing, even when wearing glasses/contacts?: No Does the patient have difficulty concentrating, remembering, or making decisions?: No Does the patient have difficulty walking or climbing stairs?: No Does the patient have difficulty dressing or bathing?: No Does the patient have difficulty doing errands alone such as visiting a doctor's office or shopping?: No   Assessment & Plan:     ICD-10-CM   1. Essential hypertension, benign  I10 losartan-hydrochlorothiazide (HYZAAR) 100-12.5 MG tablet    COMPLETE METABOLIC PANEL WITH GFR   uncontrolled, gradually worsening over past several visits, increase losartan, check labs, close f/up, monitor at home  2. Mild intermittent asthma without complication  J45.20 albuterol (VENTOLIN HFA) 108 (90 Base) MCG/ACT inhaler   well controlled, continue PRN SABA  3. Class 3 severe obesity with body mass index (BMI) of 40.0 to 44.9 in adult, unspecified obesity type, unspecified whether serious comorbidity present (HCC)  E66.01 Amb Ref to Medical Weight Management   Z68.41    open to nutritional counseling or weight management specialist consult  4. Hyperlipidemia, unspecified hyperlipidemia type  E78.5 COMPLETE METABOLIC PANEL WITH GFR    Lipid panel   recheck labs, suspect risk is overall low, but with hx of elevation may be familial?  Counseled about diet and exercise efforts discussed risk and statin meds  5. Snoring  R06.83 Ambulatory referral to Neurology   Tonsils visible but 1+, morbid obesity, may improve with weight loss, sleep study referral first and can refer to ENT if needed  6. Suspected sleep apnea  R29.818 Ambulatory referral to Neurology   OSA eval -refer to specialist for assessment and management  7. Encounter for medication monitoring  Z51.81 CBC with Differential/Platelet    COMPLETE METABOLIC PANEL WITH GFR    Lipid panel     Return for 1 month BP recheck med change.   Danelle Berry, PA-C 04/08/19 1:17 PM

## 2019-04-09 ENCOUNTER — Ambulatory Visit: Payer: BC Managed Care – PPO | Admitting: Family Medicine

## 2019-04-09 LAB — COMPLETE METABOLIC PANEL WITH GFR
AG Ratio: 1.4 (calc) (ref 1.0–2.5)
ALT: 10 U/L (ref 9–46)
AST: 16 U/L (ref 10–40)
Albumin: 4.1 g/dL (ref 3.6–5.1)
Alkaline phosphatase (APISO): 90 U/L (ref 36–130)
BUN: 11 mg/dL (ref 7–25)
CO2: 30 mmol/L (ref 20–32)
Calcium: 9.4 mg/dL (ref 8.6–10.3)
Chloride: 100 mmol/L (ref 98–110)
Creat: 1.01 mg/dL (ref 0.60–1.35)
GFR, Est African American: 107 mL/min/{1.73_m2} (ref >=60)
GFR, Est Non African American: 92 mL/min/{1.73_m2} (ref >=60)
Globulin: 3 g/dL (calc) (ref 1.9–3.7)
Glucose, Bld: 77 mg/dL (ref 65–99)
Potassium: 4.4 mmol/L (ref 3.5–5.3)
Sodium: 138 mmol/L (ref 135–146)
Total Bilirubin: 0.4 mg/dL (ref 0.2–1.2)
Total Protein: 7.1 g/dL (ref 6.1–8.1)

## 2019-04-09 LAB — LIPID PANEL
Cholesterol: 235 mg/dL — ABNORMAL HIGH (ref <200)
HDL: 49 mg/dL (ref >=40)
LDL Cholesterol (Calc): 155 mg/dL (calc) — ABNORMAL HIGH
Non-HDL Cholesterol (Calc): 186 mg/dL (calc) — ABNORMAL HIGH (ref <130)
Total CHOL/HDL Ratio: 4.8 (calc) (ref <5.0)
Triglycerides: 169 mg/dL — ABNORMAL HIGH (ref <150)

## 2019-04-09 LAB — CBC WITH DIFFERENTIAL/PLATELET
Absolute Monocytes: 602 cells/uL (ref 200–950)
Basophils Absolute: 30 cells/uL (ref 0–200)
Basophils Relative: 0.5 %
Eosinophils Absolute: 301 cells/uL (ref 15–500)
Eosinophils Relative: 5.1 %
HCT: 45.6 % (ref 38.5–50.0)
Hemoglobin: 14.9 g/dL (ref 13.2–17.1)
Lymphs Abs: 2390 cells/uL (ref 850–3900)
MCH: 27.7 pg (ref 27.0–33.0)
MCHC: 32.7 g/dL (ref 32.0–36.0)
MCV: 84.8 fL (ref 80.0–100.0)
MPV: 10.1 fL (ref 7.5–12.5)
Monocytes Relative: 10.2 %
Neutro Abs: 2578 cells/uL (ref 1500–7800)
Neutrophils Relative %: 43.7 %
Platelets: 322 10*3/uL (ref 140–400)
RBC: 5.38 10*6/uL (ref 4.20–5.80)
RDW: 13 % (ref 11.0–15.0)
Total Lymphocyte: 40.5 %
WBC: 5.9 10*3/uL (ref 3.8–10.8)

## 2019-05-05 DIAGNOSIS — H53149 Visual discomfort, unspecified: Secondary | ICD-10-CM | POA: Insufficient documentation

## 2019-05-05 DIAGNOSIS — E66813 Obesity, class 3: Secondary | ICD-10-CM | POA: Insufficient documentation

## 2019-05-05 DIAGNOSIS — R040 Epistaxis: Secondary | ICD-10-CM | POA: Insufficient documentation

## 2019-05-05 DIAGNOSIS — G4733 Obstructive sleep apnea (adult) (pediatric): Secondary | ICD-10-CM | POA: Insufficient documentation

## 2019-05-05 DIAGNOSIS — R519 Headache, unspecified: Secondary | ICD-10-CM | POA: Insufficient documentation

## 2019-05-07 ENCOUNTER — Ambulatory Visit: Payer: BC Managed Care – PPO | Attending: Internal Medicine

## 2019-05-07 ENCOUNTER — Other Ambulatory Visit: Payer: Self-pay

## 2019-05-07 DIAGNOSIS — Z20822 Contact with and (suspected) exposure to covid-19: Secondary | ICD-10-CM

## 2019-05-09 ENCOUNTER — Ambulatory Visit: Payer: BC Managed Care – PPO | Admitting: Family Medicine

## 2019-05-09 LAB — NOVEL CORONAVIRUS, NAA: SARS-CoV-2, NAA: NOT DETECTED

## 2019-05-28 ENCOUNTER — Ambulatory Visit: Payer: BC Managed Care – PPO | Attending: Neurology

## 2019-06-05 ENCOUNTER — Ambulatory Visit: Payer: BC Managed Care – PPO | Attending: Neurology

## 2019-06-06 ENCOUNTER — Ambulatory Visit: Payer: BC Managed Care – PPO | Admitting: Family Medicine

## 2019-06-06 ENCOUNTER — Other Ambulatory Visit: Payer: Self-pay

## 2019-06-06 ENCOUNTER — Encounter: Payer: Self-pay | Admitting: Family Medicine

## 2019-06-06 VITALS — BP 126/84 | HR 82 | Temp 98.3°F | Resp 14 | Ht 73.0 in | Wt 310.6 lb

## 2019-06-06 DIAGNOSIS — E785 Hyperlipidemia, unspecified: Secondary | ICD-10-CM | POA: Diagnosis not present

## 2019-06-06 DIAGNOSIS — R29818 Other symptoms and signs involving the nervous system: Secondary | ICD-10-CM | POA: Diagnosis not present

## 2019-06-06 DIAGNOSIS — I10 Essential (primary) hypertension: Secondary | ICD-10-CM

## 2019-06-06 DIAGNOSIS — Z6841 Body Mass Index (BMI) 40.0 and over, adult: Secondary | ICD-10-CM

## 2019-06-06 DIAGNOSIS — E66813 Obesity, class 3: Secondary | ICD-10-CM

## 2019-06-06 NOTE — Progress Notes (Signed)
Patient ID: Jeffrey Cohen, male    DOB: July 06, 1975, 44 y.o.   MRN: [id number]  PCP: Danelle Berry, PA-C  Chief Complaint  Patient presents with  . Follow-up  . Hypertension    Subjective:   Jeffrey Cohen is a 44 y.o. male, presents to clinic with CC of the following:  Pt here for blood pressure recheck. Lab OV was uncontrolled and elevated to 140-150 at last visit two months ago, he is here for f/up Meds were changed from losartan-HCTZ 50-12.5, increased to losartan-HCTZ 100-12.5 Pt reports home BP readings have been in the SBP 120-130's/80 and sometimes DBP in the 90's Taking meds as directed w/o problems, reports he is compliant with med changes. Pt denies chest pain, SOB, dizziness, or heart palpitations.  Denies medication side effects with dose change, has been on same dose in the past He notes he's had no more HA's, hes going out exercising and working on projects to keep his mind busy, he has work stress, but he is coping with that.  He and his wife are cooking at home, doing a lot of baking and grilling, lean meats, veggies BP Readings from Last 5 Encounters:  06/06/19 126/84  04/08/19 (!) 152/80  08/26/18 (!) 141/85  01/01/18 122/84  12/18/17 122/80  Blood pressure now better controlled  We reviewed his past labs concerning for elevated cholesterol especially LDL of 155 Hyperlipidemia: Current Medication Regimen: Not taking meds and he has been working on lifestyle efforts Today we reviewed his last labs also reviewed ASCVD risk calculation and explained different indications for initiating statin therapy Last Lipids: Lab Results  Component Value Date   CHOL 235 (H) 04/08/2019   HDL 49 04/08/2019   LDLCALC 155 (H) 04/08/2019   TRIG 169 (H) 04/08/2019   CHOLHDL 4.8 04/08/2019   - Denies: Chest pain, shortness of breath, myalgias. - Documented aortic atherosclerosis? No - Risk factors for atherosclerosis: hypercholesterolemia and hypertension The  10-year ASCVD risk score Denman George DC Jr., et al., 2013) is: 5.6%   Values used to calculate the score:     Age: 4 years     Sex: Male     Is Non-Hispanic African American: Yes     Diabetic: No     Tobacco smoker: No     Systolic Blood Pressure: 126 mmHg     Is BP treated: Yes     HDL Cholesterol: 49 mg/dL     Total Cholesterol: 235 mg/dL    Morbid obesity-with his efforts he has been able to lose 11 pounds since last visit  Wt Readings from Last 5 Encounters:  06/06/19 (!) 310 lb 9.6 oz (140.9 kg)  04/08/19 (!) 321 lb 11.2 oz (145.9 kg)  01/01/18 (!) 311 lb 12.8 oz (141.4 kg)  12/18/17 (!) 303 lb 8 oz (137.7 kg)  09/05/17 299 lb 11.2 oz (135.9 kg)   BMI Readings from Last 5 Encounters:  06/06/19 40.98 kg/m  04/08/19 42.44 kg/m  01/01/18 41.14 kg/m  12/18/17 40.04 kg/m  09/05/17 39.54 kg/m   Asthma still well controlled he has enough inhalers did send in refills at his last visit    Patient Active Problem List   Diagnosis Date Noted  . Chronic venous insufficiency 01/31/2017  . Medication monitoring encounter 10/18/2016  . Dyslipidemia, goal LDL below 130 10/18/2016  . Acanthosis nigricans 04/14/2016  . Preventative health care 04/14/2016  . Eczema 04/14/2016  . 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 Pain in limb 12/24/2015  . Essential  hypertension, benign 03/02/2015  . Obesity 03/02/2015  . Varicose veins of both lower extremities with complications 37/62/8315      Current Outpatient Medications:  .  albuterol (VENTOLIN HFA) 108 (90 Base) MCG/ACT inhaler, Inhale 2 puffs into the lungs every 6 (six) hours as needed for wheezing or shortness of breath., Disp: 18 g, Rfl: 2 .  losartan-hydrochlorothiazide (HYZAAR) 100-12.5 MG tablet, Take 1 tablet by mouth daily., Disp: 90 tablet, Rfl: 3   Allergies  Allergen Reactions  . Penicillins      Family History  Problem Relation Age of Onset  . Cancer Mother        liver  . Emphysema Mother   . Hypertension Mother   .  Crohn's disease Mother   . Hernia Mother   . Diabetes Maternal Grandfather   . Stroke Maternal Grandmother   . Asthma Daughter   . Heart disease Neg Hx   . COPD Neg Hx      Social History   Socioeconomic History  . Marital status: Married    Spouse name: Not on file  . Number of children: Not on file  . Years of education: Not on file  . Highest education level: Not on file  Occupational History  . Not on file  Tobacco Use  . Smoking status: Never Smoker  . Smokeless tobacco: Never Used  Substance and Sexual Activity  . Alcohol use: Yes    Comment: occasional  . Drug use: No  . Sexual activity: Yes  Other Topics Concern  . Not on file  Social History Narrative  . Not on file   Social Determinants of Health   Financial Resource Strain:   . Difficulty of Paying Living Expenses:   Food Insecurity:   . Worried About Charity fundraiser in the Last Year:   . Arboriculturist in the Last Year:   Transportation Needs:   . Film/video editor (Medical):   Marland Kitchen Lack of Transportation (Non-Medical):   Physical Activity:   . Days of Exercise per Week:   . Minutes of Exercise per Session:   Stress:   . Feeling of Stress :   Social Connections:   . Frequency of Communication with Friends and Family:   . Frequency of Social Gatherings with Friends and Family:   . Attends Religious Services:   . Active Member of Clubs or Organizations:   . Attends Archivist Meetings:   Marland Kitchen Marital Status:   Intimate Partner Violence:   . Fear of Current or Ex-Partner:   . Emotionally Abused:   Marland Kitchen Physically Abused:   . Sexually Abused:     Chart Review Today: I personally reviewed active problem list, medication list, allergies, family history, social history, health maintenance, notes from last encounter, lab results, imaging with the patient/caregiver today.   Review of Systems 10 Systems reviewed and are negative for acute change except as noted in the HPI.       Objective:   Vitals:   06/06/19 1145  BP: 126/84  Pulse: 82  Resp: 14  Temp: 98.3 F (36.8 C)  SpO2: 97%  Weight: (!) 310 lb 9.6 oz (140.9 kg)  Height: 6\' 1"  (1.854 m)    Body mass index is 40.98 kg/m.  Physical Exam Vitals and nursing note reviewed.  Constitutional:      General: He is not in acute distress.    Appearance: Normal appearance. He is well-developed. He is obese. He is not ill-appearing,  toxic-appearing or diaphoretic.     Interventions: Face mask in place.  HENT:     Head: Normocephalic and atraumatic.     Jaw: No trismus.     Right Ear: External ear normal.     Left Ear: External ear normal.  Eyes:     General: Lids are normal. No scleral icterus.    Conjunctiva/sclera: Conjunctivae normal.     Pupils: Pupils are equal, round, and reactive to light.  Neck:     Trachea: Trachea and phonation normal. No tracheal deviation.  Cardiovascular:     Rate and Rhythm: Normal rate and regular rhythm.     Pulses: Normal pulses.          Radial pulses are 2+ on the right side and 2+ on the left side.       Posterior tibial pulses are 2+ on the right side and 2+ on the left side.     Heart sounds: Normal heart sounds. No murmur. No friction rub. No gallop.   Pulmonary:     Effort: Pulmonary effort is normal. No respiratory distress.     Breath sounds: Normal breath sounds. No stridor. No wheezing, rhonchi or rales.  Abdominal:     General: Bowel sounds are normal. There is no distension.     Palpations: Abdomen is soft.     Tenderness: There is no abdominal tenderness. There is no guarding or rebound.  Musculoskeletal:        General: Normal range of motion.     Cervical back: Normal range of motion and neck supple.     Right lower leg: No edema.     Left lower leg: No edema.  Skin:    General: Skin is warm and dry.     Capillary Refill: Capillary refill takes less than 2 seconds.     Coloration: Skin is not jaundiced.     Findings: No rash.     Nails:  There is no clubbing.  Neurological:     Mental Status: He is alert.     Cranial Nerves: No dysarthria or facial asymmetry.     Motor: No tremor or abnormal muscle tone.     Gait: Gait normal.  Psychiatric:        Mood and Affect: Mood normal.        Speech: Speech normal.        Behavior: Behavior normal. Behavior is cooperative.      Results for orders placed or performed in visit on 05/07/19  Novel Coronavirus, NAA ([facility])   Specimen: Nasopharyngeal(NP) swabs in vial transport medium   NASOPHARYNGE  TESTING  Result Value Ref Range   SARS-CoV-2, NAA Not Detected Not Detected        Assessment & Plan:   1. Essential hypertension, benign Previously uncontrolled, his blood pressure did improve with increased dose of losartan back up to 100 and combination with hydrochlorothiazide 12.5.  He has also been working on lifestyle efforts exercising eating a much healthier diet at home with his wife.  He did note improvement in symptoms he was having including headache and he is try to manage stress in healthy ways. He is pending a sleep study which may further improve his blood pressure, it has gotten pushed back due to staffing issues and then most recently pushed back because his wife had emergency surgery.  With his home blood pressure monitoring he has noted diastolic blood pressure in the 80s to 90s, even when his blood pressure was  better controlled his diastolic never went below 80 encouraged him to monitor this because we may want to add Norvasc on to see if we can get this improved  2. Class 3 severe obesity with body mass index (BMI) of 40.0 to 44.9 in adult, unspecified obesity type, unspecified whether serious comorbidity present [facility]) He has had 11 pound weight loss in the past 2 months with his diet lifestyle efforts encouraged him to continue to work on this for optimal heart health and to help with attaining a healthier BMI  3. Hyperlipidemia, unspecified hyperlipidemia  type We reviewed his labs from 2 months ago, reviewed his ASCVD risk and further reviewed statin medications and indications for initiating a statin medicine.  He would like to continue to work on his diet lifestyle efforts and repeat his labs in about 6 months, hopefully we will see an improvement in his cholesterol panel  4. Suspected sleep apnea See above, sleep study still pending  Overall improved, patient asked to return in about 6 months for routine follow-up  Handout given with blood pressure goals and parameters with instructions to follow-up if his stealth BP is over 40 or if diastolic blood pressure on average is over 90   Danelle Berry, PA-C 06/06/19 11:58 AM

## 2019-06-06 NOTE — Patient Instructions (Signed)
Keep working on [employer] and exercise.  Continue to monitor your BP, if you systolic is >140 please come back in sooner to recheck.  If you find your diastolic BP is 90+, please come in sooner to recheck that - would need to add another medicine to decrease the diastolic.   We will recheck your labs in 6 months - let me know if you want to check them sooner

## 2019-06-20 ENCOUNTER — Ambulatory Visit: Payer: BC Managed Care – PPO | Attending: Neurology

## 2019-06-20 DIAGNOSIS — G4733 Obstructive sleep apnea (adult) (pediatric): Secondary | ICD-10-CM | POA: Insufficient documentation

## 2019-06-23 ENCOUNTER — Other Ambulatory Visit: Payer: Self-pay

## 2019-08-05 ENCOUNTER — Ambulatory Visit: Payer: BC Managed Care – PPO | Attending: Neurology

## 2019-08-05 DIAGNOSIS — G4733 Obstructive sleep apnea (adult) (pediatric): Secondary | ICD-10-CM | POA: Insufficient documentation

## 2019-08-06 ENCOUNTER — Other Ambulatory Visit: Payer: Self-pay

## 2019-12-08 ENCOUNTER — Ambulatory Visit: Payer: BC Managed Care – PPO | Admitting: Family Medicine

## 2019-12-12 NOTE — Progress Notes (Signed)
Name: Jeffrey Cohen   MRN: [id number]    DOB: 1975/05/09   Date:12/15/2019       Progress Note  Chief Complaint  Patient presents with  . Follow-up  . Hyperlipidemia  . Hypertension     Subjective:   Jeffrey Cohen is a 44 y.o. male, presents to clinic for routine f/up   Hyperlipidemia: Currently treated with no current meds, pt reports  med compliance Last Lipids: Lab Results  Component Value Date   CHOL 235 (H) 04/08/2019   HDL 49 04/08/2019   LDLCALC 155 (H) 04/08/2019   TRIG 169 (H) 04/08/2019   CHOLHDL 4.8 04/08/2019   The 10-year ASCVD risk score Denman George DC Jr., et al., 2013) is: 5.8%   Values used to calculate the score:     Age: 82 years     Sex: Male     Is Non-Hispanic African American: Yes     Diabetic: No     Tobacco smoker: No     Systolic Blood Pressure: 124 mmHg     Is BP treated: Yes     HDL Cholesterol: 49 mg/dL     Total Cholesterol: 235 mg/dL  - Denies: Chest pain, shortness of breath, myalgias, claudication  Hypertension:  Currently managed on Losartan/HCTZ 100/12.5mg  qd Pt reports good med compliance and denies any SE.   Blood pressure today is well controlled. BP Readings from Last 3 Encounters:  12/15/19 124/70  06/06/19 126/84  04/08/19 (!) 152/80   Pt denies CP, SOB, exertional sx, LE edema, palpitation, Ha's, visual disturbances, lightheadedness, hypotension, syncope. Dietary efforts for BP?  Eating better - has not been exercising as much   Current Outpatient Medications:  .  albuterol (VENTOLIN HFA) 108 (90 Base) MCG/ACT inhaler, Inhale 2 puffs into the lungs every 6 (six) hours as needed for wheezing or shortness of breath., Disp: 18 g, Rfl: 2 .  losartan-hydrochlorothiazide (HYZAAR) 100-12.5 MG tablet, Take 1 tablet by mouth daily., Disp: 90 tablet, Rfl: 3  Patient Active Problem List   Diagnosis Date Noted  . Chronic venous insufficiency 01/31/2017  . Medication monitoring encounter 10/18/2016  . Dyslipidemia,  goal LDL below 130 10/18/2016  . Acanthosis nigricans 04/14/2016  . Preventative health care 04/14/2016  . Eczema 04/14/2016  . 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 Pain in limb 12/24/2015  . Essential hypertension, benign 03/02/2015  . Class 3 severe obesity with body mass index (BMI) of 40.0 to 44.9 in adult (HCC) 03/02/2015  . Varicose veins of both lower extremities with complications 02/24/2015    Past Surgical History:  Procedure Laterality Date  . TENDON REPAIR     left thumb  . VEIN LIGATION AND STRIPPING Bilateral 10/29/2015   [id number]    Family History  Problem Relation Age of Onset  . Cancer Mother        liver  . Emphysema Mother   . Hypertension Mother   . Crohn's disease Mother   . Hernia Mother   . Diabetes Maternal Grandfather   . Stroke Maternal Grandmother   . Asthma Daughter   . Heart disease Neg Hx   . COPD Neg Hx     Social History   Tobacco Use  . Smoking status: Never Smoker  . Smokeless tobacco: Never Used  Vaping Use  . Vaping Use: Never used  Substance Use Topics  . Alcohol use: Yes    Comment: occasional  . Drug use: No     Allergies  Allergen Reactions  . Penicillins  Health Maintenance  Topic Date Due  . Hepatitis C Screening  Never done  . COVID-19 Vaccine (1) 12/31/2019 (Originally 08/31/1989)  . HIV Screening  06/05/2020 (Originally 08/31/1992)  . INFLUENZA VACCINE  06/17/2020 (Originally 10/19/2019)  . TETANUS/TDAP  03/20/2020    Chart Review Today: I personally reviewed active problem list, medication list, allergies, family history, social history, health maintenance, notes from last encounter, lab results, imaging with the patient/caregiver today.  Review of Systems  10 Systems reviewed and are negative for acute change except as noted in the HPI.    Objective:   Vitals:   12/15/19 0957  BP: 124/70  Pulse: 91  Resp: 18  Temp: 98 F (36.7 C)  TempSrc: Oral  SpO2: 99%  Weight: (!) 320 lb 3.2 oz (145.2 kg)  Height:  6\' 1"  (1.854 m)    Body mass index is 42.25 kg/m.  Physical Exam Vitals and nursing note reviewed.  Constitutional:      General: He is not in acute distress.    Appearance: Normal appearance. He is well-developed. He is obese. He is not ill-appearing, toxic-appearing or diaphoretic.     Interventions: Face mask in place.  HENT:     Head: Normocephalic and atraumatic.     Jaw: No trismus.     Right Ear: External ear normal.     Left Ear: External ear normal.  Eyes:     General: Lids are normal. No scleral icterus.       Right eye: No discharge.        Left eye: No discharge.     Conjunctiva/sclera: Conjunctivae normal.  Neck:     Trachea: Trachea and phonation normal. No tracheal deviation.  Cardiovascular:     Rate and Rhythm: Normal rate and regular rhythm.     Pulses: Normal pulses.          Radial pulses are 2+ on the right side and 2+ on the left side.       Posterior tibial pulses are 2+ on the right side and 2+ on the left side.     Heart sounds: Normal heart sounds. No murmur heard.  No friction rub. No gallop.   Pulmonary:     Effort: Pulmonary effort is normal. No respiratory distress.     Breath sounds: Normal breath sounds. No stridor. No wheezing, rhonchi or rales.  Abdominal:     General: Bowel sounds are normal. There is no distension.     Palpations: Abdomen is soft.  Musculoskeletal:     Right lower leg: No edema.     Left lower leg: No edema.  Skin:    General: Skin is warm and dry.     Coloration: Skin is not jaundiced.     Findings: No rash.     Nails: There is no clubbing.  Neurological:     Mental Status: He is alert. Mental status is at baseline.     Cranial Nerves: No dysarthria or facial asymmetry.     Motor: No tremor or abnormal muscle tone.     Gait: Gait normal.  Psychiatric:        Mood and Affect: Mood normal.        Speech: Speech normal.        Behavior: Behavior normal. Behavior is cooperative.         Assessment & Plan:    1. Essential hypertension, benign Stable well controlled on losartan-HCTZ Continue diet/lifestyle efforts/DASH diet - Comprehensive Metabolic Panel (CMET)  2. Hyperlipidemia, unspecified hyperlipidemia type Working on diet efforts, not on meds, but last lipid very elevated - Lipid panel - Comprehensive Metabolic Panel (CMET)  3. Class 3 severe obesity with body mass index (BMI) of 40.0 to 44.9 in adult, unspecified obesity type, unspecified whether serious comorbidity present (HCC) - Lipid panel - Comprehensive Metabolic Panel (CMET)  4. Encounter for hepatitis C screening test for low risk patient - Hepatitis C Antibody  5. Encounter for medication monitoring - Lipid panel - Comprehensive Metabolic Panel (CMET) - Hepatitis C Antibody  6. Mild intermittent asthma without complication Well controlled on albuterol prn   Return in about 6 months (around 06/13/2020) for Routine follow-up, Annual Physical.   Danelle Berry, PA-C 12/15/19 10:22 AM

## 2019-12-15 ENCOUNTER — Ambulatory Visit: Payer: BC Managed Care – PPO | Admitting: Family Medicine

## 2019-12-15 ENCOUNTER — Other Ambulatory Visit: Payer: Self-pay

## 2019-12-15 ENCOUNTER — Encounter: Payer: Self-pay | Admitting: Family Medicine

## 2019-12-15 VITALS — BP 124/70 | HR 91 | Temp 98.0°F | Resp 18 | Ht 73.0 in | Wt 320.2 lb

## 2019-12-15 DIAGNOSIS — Z6841 Body Mass Index (BMI) 40.0 and over, adult: Secondary | ICD-10-CM

## 2019-12-15 DIAGNOSIS — E785 Hyperlipidemia, unspecified: Secondary | ICD-10-CM

## 2019-12-15 DIAGNOSIS — Z1159 Encounter for screening for other viral diseases: Secondary | ICD-10-CM | POA: Diagnosis not present

## 2019-12-15 DIAGNOSIS — I10 Essential (primary) hypertension: Secondary | ICD-10-CM | POA: Diagnosis not present

## 2019-12-15 DIAGNOSIS — Z5181 Encounter for therapeutic drug level monitoring: Secondary | ICD-10-CM

## 2019-12-15 DIAGNOSIS — J4520 Mild intermittent asthma, uncomplicated: Secondary | ICD-10-CM

## 2019-12-15 NOTE — Patient Instructions (Signed)
Recommendations on cholesterol and starting statins.  There is a benefit from LDL-C (bad cholesterol) lowering with statin therapy at virtually all levels of cardiovascular risk.  If statin therapy had no side effects and caused no financial burden, it might be reasonable to recommend it to virtually all at-risk individuals, similar to a healthy diet and exercise  It is this good of a medication!!  It reduces risk in almost everyone.   There are possible side effects with ALL medications and with statins there is a small subset of the population who may not metabolize it well, which causing muscle aches as a side effect (~5%).  We monitor for this, can test for this, and usually are careful to work with you to get a medication that gives you the benefits with minimal side effects.  Some people are sensitive to medications in general and we try to use the highest dose tolerated to give the most benefit.  American Heart Association/American College of Cardiology cholesterol and statin guidelines are as follows:  In adults 75 to 42 years of age without diabetes mellitus and with LDL-C levels ?70, at a 10-year atherosclerotic cardiovascular disease risk of ?7.5 percent, start a moderate-intensity statin if a discussion of treatment options favors statin therapy  If LDL is >160, statins are indicated.  Patients with other significant risk factors would also benefit from statin.  Some of these factors include a family history of premature cardiovascular disease, chronic kidney disease, or chronic inflammatory disorder (such as chronic human immunodeficiency viral infection).  Can get more information at the following website:  [employer]  The 10-year ASCVD risk score Denman George DC Montez Hageman., et al., 2013) is: 5.8%   Values used to calculate the score:     Age: 44 years     Sex: Male     Is Non-Hispanic African American: Yes     Diabetic:  No     Tobacco smoker: No     Systolic Blood Pressure: 124 mmHg     Is BP treated: Yes     HDL Cholesterol: 49 mg/dL     Total Cholesterol: 235 mg/dL  Lab Results  Component Value Date   CHOL 235 (H) 04/08/2019   HDL 49 04/08/2019   LDLCALC 155 (H) 04/08/2019   TRIG 169 (H) 04/08/2019   CHOLHDL 4.8 04/08/2019

## 2020-01-20 ENCOUNTER — Encounter: Payer: Self-pay | Admitting: Family Medicine

## 2020-03-10 ENCOUNTER — Ambulatory Visit: Payer: BC Managed Care – PPO | Admitting: Podiatry

## 2020-03-10 ENCOUNTER — Encounter: Payer: Self-pay | Admitting: Podiatry

## 2020-03-10 ENCOUNTER — Other Ambulatory Visit: Payer: Self-pay

## 2020-03-10 DIAGNOSIS — Q828 Other specified congenital malformations of skin: Secondary | ICD-10-CM

## 2020-03-10 NOTE — Progress Notes (Signed)
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  He states that he is doing fairly well but like to have a new pair of orthotics.  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  Plan: Debridement of benign skin lesions bilaterally.  Order him a new set of orthotics today.

## 2020-05-07 ENCOUNTER — Telehealth: Payer: Self-pay

## 2020-05-07 NOTE — Telephone Encounter (Signed)
LVM FOR OPU 

## 2020-05-17 NOTE — Telephone Encounter (Signed)
Pt picked up orthotics,stated that he has worn them before so he didn't need the instructional sheet, will call back as needed.

## 2020-06-04 ENCOUNTER — Other Ambulatory Visit: Payer: Self-pay | Admitting: Family Medicine

## 2020-06-04 DIAGNOSIS — J4520 Mild intermittent asthma, uncomplicated: Secondary | ICD-10-CM

## 2020-06-16 ENCOUNTER — Encounter: Payer: Self-pay | Admitting: Family Medicine

## 2020-06-16 ENCOUNTER — Ambulatory Visit (INDEPENDENT_AMBULATORY_CARE_PROVIDER_SITE_OTHER): Payer: BC Managed Care – PPO | Admitting: Family Medicine

## 2020-06-16 ENCOUNTER — Other Ambulatory Visit: Payer: Self-pay

## 2020-06-16 VITALS — BP 128/82 | HR 94 | Temp 98.3°F | Resp 18 | Ht 73.0 in | Wt 313.7 lb

## 2020-06-16 DIAGNOSIS — J4520 Mild intermittent asthma, uncomplicated: Secondary | ICD-10-CM | POA: Insufficient documentation

## 2020-06-16 DIAGNOSIS — E785 Hyperlipidemia, unspecified: Secondary | ICD-10-CM

## 2020-06-16 DIAGNOSIS — Z23 Encounter for immunization: Secondary | ICD-10-CM | POA: Diagnosis not present

## 2020-06-16 DIAGNOSIS — Z1159 Encounter for screening for other viral diseases: Secondary | ICD-10-CM

## 2020-06-16 DIAGNOSIS — Z114 Encounter for screening for human immunodeficiency virus [HIV]: Secondary | ICD-10-CM

## 2020-06-16 DIAGNOSIS — I10 Essential (primary) hypertension: Secondary | ICD-10-CM | POA: Diagnosis not present

## 2020-06-16 DIAGNOSIS — Z6841 Body Mass Index (BMI) 40.0 and over, adult: Secondary | ICD-10-CM

## 2020-06-16 DIAGNOSIS — Z0000 Encounter for general adult medical examination without abnormal findings: Secondary | ICD-10-CM

## 2020-06-16 DIAGNOSIS — Z5181 Encounter for therapeutic drug level monitoring: Secondary | ICD-10-CM

## 2020-06-16 MED ORDER — LOSARTAN POTASSIUM-HCTZ 100-12.5 MG PO TABS: 1.0000 | ORAL_TABLET | Freq: Every day | ORAL | 3 refills | Status: DC

## 2020-06-16 NOTE — Progress Notes (Signed)
Patient: Jeffrey Cohen, Male    DOB: [birth year], 45 y.o.   MRN: [id number] Danelle Berry, PA-C Visit Date: 06/17/2020  Today's Provider: Danelle Berry, PA-C   Chief Complaint  Patient presents with  . Annual Exam   Subjective:   Annual physical exam:  Jeffrey Cohen is a 45 y.o. male who presents today for health maintenance and annual & complete physical exam.   Exercise/Activity:   Starting to exercise more  Diet/nutrition:  Working on healthier diet Sleep:  No concerns  do routine f/up on chronic conditions today in addition to CPE. Advised pt of separate visit billing/coding  DM? F/up on appt note today, no hx of DM - family hx  Lab Results  Component Value Date   HGBA1C 5.3 09/11/2017   HGBA1C 5.4 04/14/2016    Hypertension:  Currently managed on losartan-HCTZ Pt reports good med compliance and denies any SE.   Blood pressure today is well controlled. BP Readings from Last 3 Encounters:  12/15/19 124/70  06/06/19 126/84  04/08/19 (!) 152/80   Pt denies CP, SOB, exertional sx, LE edema, palpitation, Ha's, visual disturbances, lightheadedness, hypotension, syncope.   Hyperlipidemia: Currently treated with diet/lifestyle no meds Last Lipids: Lab Results  Component Value Date   CHOL 235 (H) 04/08/2019   HDL 49 04/08/2019   LDLCALC 155 (H) 04/08/2019   TRIG 169 (H) 04/08/2019   CHOLHDL 4.8 04/08/2019   - Denies: Chest pain, shortness of breath, myalgias, claudication The 10-year ASCVD risk score Denman George DC Jr., et al., 2013) is: 5.8%   Values used to calculate the score:     Age: 52 years     Sex: Male     Is Non-Hispanic African American: Yes     Diabetic: No     Tobacco smoker: No     Systolic Blood Pressure: 124 mmHg     Is BP treated: Yes     HDL Cholesterol: 49 mg/dL     Total Cholesterol: 235 mg/dL   USPSTF grade A and B recommendations - reviewed and addressed today  Depression:  Phq 9 completed today by patient, was reviewed  by me with patient in the room, score is  negative, pt feels good PHQ 2/9 Scores 06/16/2020 12/15/2019 06/06/2019 04/08/2019  PHQ - 2 Score 0 0 0 1  PHQ- 9 Score 0 - 0 5   Depression screen [facility] 2/9 06/16/2020 12/15/2019 06/06/2019 04/08/2019 12/18/2017  Decreased Interest 0 0 0 0 0  Down, Depressed, Hopeless 0 0 0 1 0  PHQ - 2 Score 0 0 0 1 0  Altered sleeping 0 - 0 1 0  Tired, decreased energy 0 - 0 1 0  Change in appetite 0 - 0 1 0  Feeling bad or failure about yourself  0 - 0 1 0  Trouble concentrating 0 - 0 0 0  Moving slowly or fidgety/restless 0 - 0 0 0  Suicidal thoughts 0 - 0 0 0  PHQ-9 Score 0 - 0 5 0  Difficult doing work/chores Not difficult at all - Not difficult at all Not difficult at all Not difficult at all    Hep C Screening:   duie STD testing and prevention (HIV/chl/gon/syphilis):  No risk, monogamous  Intimate partner violence:  safe Prostate cancer:  Prostate cancer screening with PSA: Discussed risks and benefits of PSA testing and provided handout. Pt declines to have PSA drawn today.  Advanced Care Planning:  A voluntary discussion about advance care planning  including the explanation and discussion of advance directives.  Discussed health care proxy and Living will, and the patient was able to identify a health care proxy as Jeffrey Cohen.  Patient does not have a living will at present time. If patient does have living will, I have requested they bring this to the clinic to be scanned in to their chart.  Health Maintenance  Topic Date Due  . COVID-19 Vaccine (1) Never done  . INFLUENZA VACCINE  06/17/2020 (Originally 10/19/2019)  . TETANUS/TDAP  06/17/2030  . Hepatitis C Screening  Completed  . HIV Screening  Completed  . HPV VACCINES  Aged Out     Skin cancer: Pt reports  hx of skin cancer, suspicious lesions/biopsies in the past.  Colorectal cancer:  Colonoscopy - not yet due  Pt denies melena, hematochezia  Lung cancer:   Low Dose CT Chest recommended  if Age 37-80 years, 20 pack-year currently smoking OR have quit w/in 15years. Patient does not qualify.   Social History   Tobacco Use  . Smoking status: Never Smoker  . Smokeless tobacco: Never Used  Substance Use Topics  . Alcohol use: Yes    Comment: occasional     Alcohol screening: Flowsheet Row Office Visit from [facility]/15/2019 [facility]  AUDIT-C Score 1      AAA:  The USPSTF recommends one-time screening with ultrasonography in men ages 37 to 27 years who have ever smoked  ECG:  Not indicated   Blood pressure/Hypertension: BP Readings from Last 3 Encounters:  06/16/20 128/82  12/15/19 124/70  06/06/19 126/84   Weight/Obesity: Wt Readings from Last 3 Encounters:  06/16/20 (!) 313 lb 11.2 oz (142.3 kg)  12/15/19 (!) 320 lb 3.2 oz (145.2 kg)  06/06/19 (!) 310 lb 9.6 oz (140.9 kg)   BMI Readings from Last 3 Encounters:  06/16/20 41.39 kg/m  12/15/19 42.25 kg/m  06/06/19 40.98 kg/m    Lipids:  Lab Results  Component Value Date   CHOL 222 (H) 06/16/2020   CHOL 235 (H) 04/08/2019   CHOL 228 (H) 09/11/2017   Lab Results  Component Value Date   HDL 46 06/16/2020   HDL 49 04/08/2019   HDL 52 09/11/2017   Lab Results  Component Value Date   LDLCALC 146 (H) 06/16/2020   LDLCALC 155 (H) 04/08/2019   LDLCALC 151 (H) 09/11/2017   Lab Results  Component Value Date   TRIG 161 (H) 06/16/2020   TRIG 169 (H) 04/08/2019   TRIG 126 09/11/2017   Lab Results  Component Value Date   CHOLHDL 4.8 06/16/2020   CHOLHDL 4.8 04/08/2019   CHOLHDL 4.4 09/11/2017   No results found for: LDLDIRECT Based on the results of lipid panel his/her cardiovascular risk factor ( using [place] Cohort )  in the next 10 years is : The 10-year ASCVD risk score Denman George DC Montez Hageman., et al., 2013) is: 6.1%   Values used to calculate the score:     Age: 77 years     Sex: Male     Is Non-Hispanic African American: Yes     Diabetic: No     Tobacco smoker: No     Systolic  Blood Pressure: 128 mmHg     Is BP treated: Yes     HDL Cholesterol: 46 mg/dL     Total Cholesterol: 222 mg/dL Glucose:  Glucose, Bld  Date Value Ref Range Status  06/16/2020 83 65 - 99 mg/dL Final    Comment:    .  Fasting reference interval .   04/08/2019 77 65 - 99 mg/dL Final    Comment:    .            Fasting reference interval .   09/11/2017 88 65 - 139 mg/dL Final    Comment:    .        Non-fasting reference interval .     Social History      He  reports that he has never smoked. He has never used smokeless tobacco. He reports current alcohol use. He reports that he does not use drugs.       Social History   Socioeconomic History  . Marital status: Married    Spouse name: Not on file  . Number of children: Not on file  . Years of education: Not on file  . Highest education level: Not on file  Occupational History  . Not on file  Tobacco Use  . Smoking status: Never Smoker  . Smokeless tobacco: Never Used  Vaping Use  . Vaping Use: Never used  Substance and Sexual Activity  . Alcohol use: Yes    Comment: occasional  . Drug use: No  . Sexual activity: Yes  Other Topics Concern  . Not on file  Social History Narrative  . Not on file   Social Determinants of Health   Financial Resource Strain: Not on file  Food Insecurity: Not on file  Transportation Needs: Not on file  Physical Activity: Not on file  Stress: Not on file  Social Connections: Not on file     Family History        Family Status  Relation Name Status  . Mother  Deceased       surgery: crohns dis  . Father  Deceased       natural causes  . MGF  Deceased       old age  . Daughter  Alive  . MGM  Deceased       stroke  . PGM  Deceased       unknown  . PGF  Alive  . Daughter middle Alive  . Daughter  Alive  . Neg Hx  (Not Specified)        His family history includes Asthma in his daughter; Cancer in his mother; Crohn's disease in his mother; Diabetes in his  maternal grandfather; Emphysema in his mother; Hernia in his mother; Hypertension in his mother; Stroke in his maternal grandmother. There is no history of Heart disease or COPD.       Family History  Problem Relation Age of Onset  . Cancer Mother        liver  . Emphysema Mother   . Hypertension Mother   . Crohn's disease Mother   . Hernia Mother   . Diabetes Maternal Grandfather   . Stroke Maternal Grandmother   . Asthma Daughter   . Heart disease Neg Hx   . COPD Neg Hx     Patient Active Problem List   Diagnosis Date Noted  . Mild intermittent asthma without complication 06/16/2020  . Chronic venous insufficiency 01/31/2017  . Medication monitoring encounter 10/18/2016  . Hyperlipidemia 10/18/2016  . Acanthosis nigricans 04/14/2016  . Preventative health care 04/14/2016  . Eczema 04/14/2016  . 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 Pain in limb 12/24/2015  . Essential hypertension, benign 03/02/2015  . Class 3 severe obesity with body mass index (BMI) of 40.0 to 44.9 in adult (HCC) 03/02/2015  .  Varicose veins of both lower extremities with complications 02/24/2015    Past Surgical History:  Procedure Laterality Date  . TENDON REPAIR     left thumb  . VEIN LIGATION AND STRIPPING Bilateral 10/29/2015   [id number]     Current Outpatient Medications:  .  albuterol (VENTOLIN HFA) 108 (90 Base) MCG/ACT inhaler, INHALE 2 PUFFS BY MOUTH EVERY 6 HOURS AS NEEDED FOR WHEEZING FOR SHORTNESS OF BREATH, Disp: 18 g, Rfl: 0 .  losartan-hydrochlorothiazide (HYZAAR) 100-12.5 MG tablet, Take 1 tablet by mouth daily., Disp: 90 tablet, Rfl: 3 .  nortriptyline (PAMELOR) 10 MG capsule, Take by mouth., Disp: , Rfl:  .  rizatriptan (MAXALT) 10 MG tablet, Take by mouth., Disp: , Rfl:   Allergies  Allergen Reactions  . Penicillins     Patient Care Team: Danelle Berry, PA-C as PCP - General (Family Medicine)   Chart Review: I personally reviewed active problem list, medication list, allergies,  family history, social history, health maintenance, notes from last encounter, lab results, imaging with the patient/caregiver today.   Review of Systems  Constitutional: Negative.   HENT: Negative.   Eyes: Negative.   Respiratory: Negative.   Cardiovascular: Negative.   Gastrointestinal: Negative.   Endocrine: Negative.   Genitourinary: Negative.   Musculoskeletal: Negative.   Skin: Negative.   Allergic/Immunologic: Negative.   Neurological: Negative.   Hematological: Negative.   Psychiatric/Behavioral: Negative.   All other systems reviewed and are negative.         Objective:   Vitals:  Vitals:   06/16/20 1411  BP: 128/82  Pulse: 94  Resp: 18  Temp: 98.3 F (36.8 C)  SpO2: 98%  Weight: (!) 313 lb 11.2 oz (142.3 kg)  Height: 6\' 1"  (1.854 m)    Body mass index is 41.39 kg/m.  Physical Exam Constitutional:      General: He is not in acute distress.    Appearance: Normal appearance. He is well-developed. He is obese. He is not ill-appearing or toxic-appearing.  HENT:     Head: Normocephalic and atraumatic.     Jaw: No trismus.     Right Ear: Tympanic membrane, ear canal and external ear normal.     Left Ear: Tympanic membrane, ear canal and external ear normal.     Nose: No mucosal edema or rhinorrhea.     Right Sinus: No maxillary sinus tenderness or frontal sinus tenderness.     Left Sinus: No maxillary sinus tenderness or frontal sinus tenderness.     Mouth/Throat:     Pharynx: Uvula midline. No oropharyngeal exudate, posterior oropharyngeal erythema or uvula swelling.  Eyes:     General: Lids are normal.     Conjunctiva/sclera: Conjunctivae normal.     Pupils: Pupils are equal, round, and reactive to light.  Neck:     Trachea: Trachea and phonation normal. No tracheal deviation.  Cardiovascular:     Rate and Rhythm: Regular rhythm.     Pulses: Normal pulses.          Radial pulses are 2+ on the right side and 2+ on the left side.       Posterior  tibial pulses are 2+ on the right side and 2+ on the left side.     Heart sounds: Normal heart sounds. No murmur heard. No friction rub. No gallop.   Pulmonary:     Effort: Pulmonary effort is normal.     Breath sounds: Normal breath sounds. No wheezing, rhonchi or rales.  Abdominal:  General: Bowel sounds are normal. There is no distension.     Palpations: Abdomen is soft.     Tenderness: There is no abdominal tenderness. There is no guarding or rebound.  Musculoskeletal:        General: Normal range of motion.     Cervical back: Normal range of motion and neck supple.  Skin:    General: Skin is warm and dry.     Capillary Refill: Capillary refill takes less than 2 seconds.     Findings: No rash.  Neurological:     Mental Status: He is alert and oriented to person, place, and time.     Gait: Gait normal.  Psychiatric:        Speech: Speech normal.        Behavior: Behavior normal.      Recent Results (from the past 2160 hour(s))  CBC with Differential/Platelet     Status: None   Collection Time: 06/16/20  2:26 PM  Result Value Ref Range   WBC 6.5 3.8 - 10.8 Thousand/uL   RBC 5.32 4.20 - 5.80 Million/uL   Hemoglobin 14.6 13.2 - 17.1 g/dL   HCT 96.245.1 95.238.5 - 84.150.0 %   MCV 84.8 80.0 - 100.0 fL   MCH 27.4 27.0 - 33.0 pg   MCHC 32.4 32.0 - 36.0 g/dL   RDW 32.412.8 40.111.0 - 02.715.0 %   Platelets 333 140 - 400 Thousand/uL   MPV 10.1 7.5 - 12.5 fL   Neutro Abs 3,283 1,500 - 7,800 cells/uL   Lymphs Abs 2,314 850 - 3,900 cells/uL   Absolute Monocytes 670 200 - 950 cells/uL   Eosinophils Absolute 202 15 - 500 cells/uL   Basophils Absolute 33 0 - 200 cells/uL   Neutrophils Relative % 50.5 %   Total Lymphocyte 35.6 %   Monocytes Relative 10.3 %   Eosinophils Relative 3.1 %   Basophils Relative 0.5 %  COMPLETE METABOLIC PANEL WITH GFR     Status: None   Collection Time: 06/16/20  2:26 PM  Result Value Ref Range   Glucose, Bld 83 65 - 99 mg/dL    Comment: .            Fasting  reference interval .    BUN 14 7 - 25 mg/dL   Creat 2.531.02 6.640.60 - 4.031.35 mg/dL   GFR, Est Non African American 90 > OR = 60 mL/min/1.5673m2   GFR, Est African American 105 > OR = 60 mL/min/1.873m2   BUN/Creatinine Ratio NOT APPLICABLE 6 - 22 (calc)   Sodium 136 135 - 146 mmol/L   Potassium 4.1 3.5 - 5.3 mmol/L   Chloride 98 98 - 110 mmol/L   CO2 28 20 - 32 mmol/L   Calcium 9.2 8.6 - 10.3 mg/dL   Total Protein 7.3 6.1 - 8.1 g/dL   Albumin 4.1 3.6 - 5.1 g/dL   Globulin 3.2 1.9 - 3.7 g/dL (calc)   AG Ratio 1.3 1.0 - 2.5 (calc)   Total Bilirubin 0.7 0.2 - 1.2 mg/dL   Alkaline phosphatase (APISO) 85 36 - 130 U/L   AST 16 10 - 40 U/L   ALT 10 9 - 46 U/L  Lipid panel     Status: Abnormal   Collection Time: 06/16/20  2:26 PM  Result Value Ref Range   Cholesterol 222 (H) <200 mg/dL   HDL 46 > OR = 40 mg/dL   Triglycerides 474161 (H) <150 mg/dL   LDL Cholesterol (Calc) 146 (H) mg/dL (calc)  Comment: Reference range: <100 . Desirable range <100 mg/dL for primary prevention;   <70 mg/dL for patients with CHD or diabetic patients  with > or = 2 CHD risk factors. Marland Kitchen LDL-C is now calculated using the Martin-Hopkins  calculation, which is a validated novel method providing  better accuracy than the Friedewald equation in the  estimation of LDL-C.  Horald Pollen et al. Lenox Ahr. 7673;419(37): 2061-2068  (http://education.[facility].com/faq/FAQ164)    Total CHOL/HDL Ratio 4.8 <5.0 (calc)   Non-HDL Cholesterol (Calc) 176 (H) <130 mg/dL (calc)    Comment: For patients with diabetes plus 1 major ASCVD risk  factor, treating to a non-HDL-C goal of <100 mg/dL  (LDL-C of <90 mg/dL) is considered a therapeutic  option.   Hemoglobin A1c     Status: None   Collection Time: 06/16/20  2:26 PM  Result Value Ref Range   Hgb A1c MFr Bld 5.6 <5.7 % of total Hgb    Comment: For the purpose of screening for the presence of diabetes: . <5.7%       Consistent with the absence of diabetes 5.7-6.4%     Consistent with increased risk for diabetes             (prediabetes) > or =6.5%  Consistent with diabetes . This assay result is consistent with a decreased risk of diabetes. . Currently, no consensus exists regarding use of hemoglobin A1c for diagnosis of diabetes in children. . According to American Diabetes Association (ADA) guidelines, hemoglobin A1c <7.0% represents optimal control in non-pregnant diabetic patients. Different metrics may apply to specific patient populations.  Standards of Medical Care in Diabetes(ADA). .    Mean Plasma Glucose 114 mg/dL   eAG (mmol/L) 6.3 mmol/L  HIV Antibody (routine testing w rflx)     Status: None   Collection Time: 06/16/20  2:26 PM  Result Value Ref Range   HIV 1&2 Ab, 4th Generation NON-REACTIVE NON-REACTI    Comment: HIV-1 antigen and HIV-1/HIV-2 antibodies were not detected. There is no laboratory evidence of HIV infection. Marland Kitchen PLEASE NOTE: This information has been disclosed to you from records whose confidentiality may be protected by state law.  If your state requires such protection, then the state law prohibits you from making any further disclosure of the information without the specific written consent of the person to whom it pertains, or as otherwise permitted by law. A general authorization for the release of medical or other information is NOT sufficient for this purpose. . For additional information please refer to http://education.[facility].com/faq/FAQ106 (This link is being provided for informational/ educational purposes only.) . Marland Kitchen The performance of this assay has not been clinically validated in patients less than 93 years old. .   Hepatitis C antibody     Status: None   Collection Time: 06/16/20  2:26 PM  Result Value Ref Range   Hepatitis C Ab NON-REACTIVE NON-REACTI   SIGNAL TO CUT-OFF 0.04 <1.00    Comment: . HCV antibody was non-reactive. There is no laboratory  evidence of HCV  infection. . In most cases, no further action is required. However, if recent HCV exposure is suspected, a test for HCV RNA (test code 24097) is suggested. . For additional information please refer to http://education.[facility].com/faq/FAQ22v1 (This link is being provided for informational/ educational purposes only.) .     Diabetic Foot Exam: Diabetic Foot Exam - Simple   No data filed     Fall Risk: Fall Risk  06/16/2020 12/15/2019 06/06/2019 04/08/2019 12/18/2017  Falls in  the past year? 0 0 0 0 No  Number falls in past yr: 0 0 0 0 -  Injury with Fall? 0 0 0 0 -  Comment - - - - -  Follow up - Falls evaluation completed - - -    Functional Status Survey: Is the patient deaf or have difficulty hearing?: No Does the patient have difficulty seeing, even when wearing glasses/contacts?: No Does the patient have difficulty concentrating, remembering, or making decisions?: No Does the patient have difficulty walking or climbing stairs?: No Does the patient have difficulty dressing or bathing?: No Does the patient have difficulty doing errands alone such as visiting a doctor's office or shopping?: No   Assessment & Plan:    CPE completed today  . Prostate cancer screening and PSA options (with potential risks and benefits of testing vs not testing) were discussed along with recent recs/guidelines, shared decision making and handout/information given to pt today  . USPSTF grade A and B recommendations reviewed with patient; age-appropriate recommendations, preventive care, screening tests, etc discussed and encouraged; healthy living encouraged; see AVS for patient education given to patient  . Discussed importance of 150 minutes of physical activity weekly, AHA exercise recommendations given to pt in AVS/handout  . Discussed importance of healthy diet:  eating lean meats and proteins, avoiding trans fats and saturated fats, avoid simple sugars and excessive carbs in diet,  eat 6 servings of fruit/vegetables daily and drink plenty of water and avoid sweet beverages.  DASH diet reviewed if pt has HTN  . Recommended pt to do annual eye exam and routine dental exams/cleanings  . Reviewed Health Maintenance: Health Maintenance  Topic Date Due  . COVID-19 Vaccine (1) Never done  . INFLUENZA VACCINE  06/17/2020 (Originally 10/19/2019)  . TETANUS/TDAP  06/17/2030  . Hepatitis C Screening  Completed  . HIV Screening  Completed  . HPV VACCINES  Aged Out    . Immunizations: Immunization History  Administered Date(s) Administered  . Tdap 03/20/2010, 06/16/2020     ICD-10-CM   1. Adult general medical exam  Z00.00 CBC with Differential/Platelet    COMPLETE METABOLIC PANEL WITH GFR    Lipid panel    Hemoglobin A1c    HIV Antibody (routine testing w rflx)    Hepatitis C antibody  2. Essential hypertension, benign  I10 COMPLETE METABOLIC PANEL WITH GFR    losartan-hydrochlorothiazide (HYZAAR) 100-12.5 MG tablet   well controlled today, good med compliance, working on diet and exercise   3. Class 3 severe obesity with body mass index (BMI) of 40.0 to 44.9 in adult, unspecified obesity type, unspecified whether serious comorbidity present (HCC)  E66.01 CBC with Differential/Platelet   Z68.41 COMPLETE METABOLIC PANEL WITH GFR    Lipid panel    Hemoglobin A1c   weight down  4. Hyperlipidemia, unspecified hyperlipidemia type  E78.5 COMPLETE METABOLIC PANEL WITH GFR    Lipid panel   elevated, not on meds, discussed risk   5. Encounter for hepatitis C screening test for low risk patient  Z11.59 Hepatitis C antibody  6. Mild intermittent asthma without complication  J45.20    well controlled, some EIB, encouraged pt to try preventative inhaler use - otherwise well controlled  7. Need for tetanus booster  Z23 Tdap vaccine greater than or equal to 7yo IM  8. Screening for HIV without presence of risk factors  Z11.4 HIV Antibody (routine testing w rflx)  9. Encounter for  medication monitoring  Z51.81 CBC with Differential/Platelet  COMPLETE METABOLIC PANEL WITH GFR    Lipid panel    Hemoglobin A1c     Danelle Berry, PA-C 06/17/20 4:39 PM  [facility]

## 2020-06-16 NOTE — Patient Instructions (Addendum)
Preventive Care 43-43 Years Old, Male Preventive care refers to lifestyle choices and visits with your health care provider that can promote health and wellness. This includes:  A yearly physical exam. This is also called an annual wellness visit.  Regular dental and eye exams.  Immunizations.  Screening for certain conditions.  Healthy lifestyle choices, such as: ? Eating a healthy diet. ? Getting regular exercise. ? Not using drugs or products that contain nicotine and tobacco. ? Limiting alcohol use. What can I expect for my preventive care visit? Physical exam Your health care provider will check your:  Height and weight. These may be used to calculate your BMI (body mass index). BMI is a measurement that tells if you are at a healthy weight.  Heart rate and blood pressure.  Body temperature.  Skin for abnormal spots. Counseling Your health care provider may ask you questions about your:  Past medical problems.  Family's medical history.  Alcohol, tobacco, and drug use.  Emotional well-being.  Home life and relationship well-being.  Sexual activity.  Diet, exercise, and sleep habits.  Work and work environment.  Access to firearms. What immunizations do I need? Vaccines are usually given at various ages, according to a schedule. Your health care provider will recommend vaccines for you based on your age, medical history, and lifestyle or other factors, such as travel or where you work.   What tests do I need? Blood tests  Lipid and cholesterol levels. These may be checked every 5 years, or more often if you are over 43 years old.  Hepatitis C test.  Hepatitis B test. Screening  Lung cancer screening. You may have this screening every year starting at age 43 if you have a 30-pack-year history of smoking and currently smoke or have quit within the past 15 years.  Prostate cancer screening. Recommendations will vary depending on your family history and  other risks.  Genital exam to check for testicular cancer or hernias.  Colorectal cancer screening. ? All adults should have this screening starting at age 43 and continuing until age 75. ? Your health care provider may recommend screening at age 43 if you are at increased risk. ? You will have tests every 1-10 years, depending on your results and the type of screening test.  Diabetes screening. ? This is done by checking your blood sugar (glucose) after you have not eaten for a while (fasting). ? You may have this done every 1-3 years.  STD (sexually transmitted disease) testing, if you are at risk. Follow these instructions at home: Eating and drinking  Eat a diet that includes fresh fruits and vegetables, whole grains, lean protein, and low-fat dairy products.  Take vitamin and mineral supplements as recommended by your health care provider.  Do not drink alcohol if your health care provider tells you not to drink.  If you drink alcohol: ? Limit how much you have to 0-2 drinks a day. ? Be aware of how much alcohol is in your drink. In the [place] one drink equals one 12 oz bottle of beer (355 mL), one 5 oz glass of wine (148 mL), or one 1 oz glass of hard liquor (44 mL).   Lifestyle  Take daily care of your teeth and gums. Brush your teeth every morning and night with fluoride toothpaste. Floss one time each day.  Stay active. Exercise for at least 30 minutes 5 or more days each week.  Do not use any products that contain nicotine or   tobacco, such as cigarettes, e-cigarettes, and chewing tobacco. If you need help quitting, ask your health care provider.  Do not use drugs.  If you are sexually active, practice safe sex. Use a condom or other form of protection to prevent STIs (sexually transmitted infections).  If told by your health care provider, take low-dose aspirin daily starting at age 43.  Find healthy ways to cope with stress, such as: ? Meditation, yoga, or  listening to music. ? Journaling. ? Talking to a trusted person. ? Spending time with friends and family. Safety  Always wear your seat belt while driving or riding in a vehicle.  Do not drive: ? If you have been drinking alcohol. Do not ride with someone who has been drinking. ? When you are tired or distracted. ? While texting.  Wear a helmet and other protective equipment during sports activities.  If you have firearms in your house, make sure you follow all gun safety procedures. What's next?  Go to your health care provider once a year for an annual wellness visit.  Ask your health care provider how often you should have your eyes and teeth checked.  Stay up to date on all vaccines. This information is not intended to replace advice given to you by your health care provider. Make sure you discuss any questions you have with your health care provider. Document Revised: 12/03/2018 Document Reviewed: 02/28/2018 Elsevier Patient Education  2021 Elsevier Inc.    DASH Eating Plan DASH stands for Dietary Approaches to Stop Hypertension. The DASH eating plan is a healthy eating plan that has been shown to:  Reduce high blood pressure (hypertension).  Reduce your risk for type 2 diabetes, heart disease, and stroke.  Help with weight loss. What are tips for following this plan? Reading food labels  Check food labels for the amount of salt (sodium) per serving. Choose foods with less than 5 percent of the Daily Value of sodium. Generally, foods with less than 300 milligrams (mg) of sodium per serving fit into this eating plan.  To find whole grains, look for the word "whole" as the first word in the ingredient list. Shopping  Buy products labeled as "low-sodium" or "no salt added."  Buy fresh foods. Avoid canned foods and pre-made or frozen meals. Cooking  Avoid adding salt when cooking. Use salt-free seasonings or herbs instead of table salt or sea salt. Check with your  health care provider or pharmacist before using salt substitutes.  Do not fry foods. Cook foods using healthy methods such as baking, boiling, grilling, roasting, and broiling instead.  Cook with heart-healthy oils, such as olive, canola, avocado, soybean, or sunflower oil. Meal planning  Eat a balanced diet that includes: ? 4 or more servings of fruits and 4 or more servings of vegetables each day. Try to fill one-half of your plate with fruits and vegetables. ? 6-8 servings of whole grains each day. ? Less than 6 oz (170 g) of lean meat, poultry, or fish each day. A 3-oz (85-g) serving of meat is about the same size as a deck of cards. One egg equals 1 oz (28 g). ? 2-3 servings of low-fat dairy each day. One serving is 1 cup (237 mL). ? 1 serving of nuts, seeds, or beans 5 times each week. ? 2-3 servings of heart-healthy fats. Healthy fats called omega-3 fatty acids are found in foods such as walnuts, flaxseeds, fortified milks, and eggs. These fats are also found in cold-water fish, such  as sardines, salmon, and mackerel.  Limit how much you eat of: ? Canned or prepackaged foods. ? Food that is high in trans fat, such as some fried foods. ? Food that is high in saturated fat, such as fatty meat. ? Desserts and other sweets, sugary drinks, and other foods with added sugar. ? Full-fat dairy products.  Do not salt foods before eating.  Do not eat more than 4 egg yolks a week.  Try to eat at least 2 vegetarian meals a week.  Eat more home-cooked food and less restaurant, buffet, and fast food.   Lifestyle  When eating at a restaurant, ask that your food be prepared with less salt or no salt, if possible.  If you drink alcohol: ? Limit how much you use to:  0-1 drink a day for women who are not pregnant.  0-2 drinks a day for men. ? Be aware of how much alcohol is in your drink. In the [place] one drink equals one 12 oz bottle of beer (355 mL), one 5 oz glass of wine (148 mL), or  one 1 oz glass of hard liquor (44 mL). General information  Avoid eating more than 2,300 mg of salt a day. If you have hypertension, you may need to reduce your sodium intake to 1,500 mg a day.  Work with your health care provider to maintain a healthy body weight or to lose weight. Ask what an ideal weight is for you.  Get at least 30 minutes of exercise that causes your heart to beat faster (aerobic exercise) most days of the week. Activities may include walking, swimming, or biking.  Work with your health care provider or dietitian to adjust your eating plan to your individual calorie needs. What foods should I eat? Fruits All fresh, dried, or frozen fruit. Canned fruit in natural juice (without added sugar). Vegetables Fresh or frozen vegetables (raw, steamed, roasted, or grilled). Low-sodium or reduced-sodium tomato and vegetable juice. Low-sodium or reduced-sodium tomato sauce and tomato paste. Low-sodium or reduced-sodium canned vegetables. Grains Whole-grain or whole-wheat bread. Whole-grain or whole-wheat pasta. Brown rice. Orpah Cobb. Bulgur. Whole-grain and low-sodium cereals. Pita bread. Low-fat, low-sodium crackers. Whole-wheat flour tortillas. Meats and other proteins Skinless chicken or [place]. Ground chicken or [place]. Pork with fat trimmed off. Fish and seafood. Egg whites. Dried beans, peas, or lentils. Unsalted nuts, nut butters, and seeds. Unsalted canned beans. Lean cuts of beef with fat trimmed off. Low-sodium, lean precooked or cured meat, such as sausages or meat loaves. Dairy Low-fat (1%) or fat-free (skim) milk. Reduced-fat, low-fat, or fat-free cheeses. Nonfat, low-sodium ricotta or cottage cheese. Low-fat or nonfat yogurt. Low-fat, low-sodium cheese. Fats and oils Soft margarine without trans fats. Vegetable oil. Reduced-fat, low-fat, or light mayonnaise and salad dressings (reduced-sodium). Canola, safflower, olive, avocado, soybean, and sunflower oils.  Avocado. Seasonings and condiments Herbs. Spices. Seasoning mixes without salt. Other foods Unsalted popcorn and pretzels. Fat-free sweets. The items listed above may not be a complete list of foods and beverages you can eat. Contact a dietitian for more information. What foods should I avoid? Fruits Canned fruit in a light or heavy syrup. Fried fruit. Fruit in cream or butter sauce. Vegetables Creamed or fried vegetables. Vegetables in a cheese sauce. Regular canned vegetables (not low-sodium or reduced-sodium). Regular canned tomato sauce and paste (not low-sodium or reduced-sodium). Regular tomato and vegetable juice (not low-sodium or reduced-sodium). Rosita Fire. Olives. Grains Baked goods made with fat, such as croissants, muffins, or some breads. Dry  pasta or rice meal packs. Meats and other proteins Fatty cuts of meat. Ribs. Fried meat. Tomasa Blase. Bologna, salami, and other precooked or cured meats, such as sausages or meat loaves. Fat from the back of a pig (fatback). Bratwurst. Salted nuts and seeds. Canned beans with added salt. Canned or smoked fish. Whole eggs or egg yolks. Chicken or [place] with skin. Dairy Whole or 2% milk, cream, and half-and-half. Whole or full-fat cream cheese. Whole-fat or sweetened yogurt. Full-fat cheese. Nondairy creamers. Whipped toppings. Processed cheese and cheese spreads. Fats and oils Butter. Stick margarine. Lard. Shortening. Ghee. Bacon fat. Tropical oils, such as coconut, palm kernel, or palm oil. Seasonings and condiments Onion salt, garlic salt, seasoned salt, table salt, and sea salt. Worcestershire sauce. Tartar sauce. Barbecue sauce. Teriyaki sauce. Soy sauce, including reduced-sodium. Steak sauce. Canned and packaged gravies. Fish sauce. Oyster sauce. Cocktail sauce. Store-bought horseradish. Ketchup. Mustard. Meat flavorings and tenderizers. Bouillon cubes. Hot sauces. Pre-made or packaged marinades. Pre-made or packaged taco seasonings. Relishes.  Regular salad dressings. Other foods Salted popcorn and pretzels. The items listed above may not be a complete list of foods and beverages you should avoid. Contact a dietitian for more information. Where to find more information  National Heart, Lung, and Blood Institute: PopSteam.is  American Heart Association: www.heart.org  Academy of Nutrition and Dietetics: www.eatright.org  National Kidney Foundation: www.kidney.org Summary  The DASH eating plan is a healthy eating plan that has been shown to reduce high blood pressure (hypertension). It may also reduce your risk for type 2 diabetes, heart disease, and stroke.  When on the DASH eating plan, aim to eat more fresh fruits and vegetables, whole grains, lean proteins, low-fat dairy, and heart-healthy fats.  With the DASH eating plan, you should limit salt (sodium) intake to 2,300 mg a day. If you have hypertension, you may need to reduce your sodium intake to 1,500 mg a day.  Work with your health care provider or dietitian to adjust your eating plan to your individual calorie needs. This information is not intended to replace advice given to you by your health care provider. Make sure you discuss any questions you have with your health care provider. Document Revised: 02/07/2019 Document Reviewed: 02/07/2019 Elsevier Patient Education  2021 [employer].

## 2020-06-17 ENCOUNTER — Encounter: Payer: Self-pay | Admitting: Family Medicine

## 2020-06-17 LAB — CBC WITH DIFFERENTIAL/PLATELET
Absolute Monocytes: 670 cells/uL (ref 200–950)
Basophils Absolute: 33 cells/uL (ref 0–200)
Basophils Relative: 0.5 %
Eosinophils Absolute: 202 cells/uL (ref 15–500)
Eosinophils Relative: 3.1 %
HCT: 45.1 % (ref 38.5–50.0)
Hemoglobin: 14.6 g/dL (ref 13.2–17.1)
Lymphs Abs: 2314 cells/uL (ref 850–3900)
MCH: 27.4 pg (ref 27.0–33.0)
MCHC: 32.4 g/dL (ref 32.0–36.0)
MCV: 84.8 fL (ref 80.0–100.0)
MPV: 10.1 fL (ref 7.5–12.5)
Monocytes Relative: 10.3 %
Neutro Abs: 3283 cells/uL (ref 1500–7800)
Neutrophils Relative %: 50.5 %
Platelets: 333 10*3/uL (ref 140–400)
RBC: 5.32 10*6/uL (ref 4.20–5.80)
RDW: 12.8 % (ref 11.0–15.0)
Total Lymphocyte: 35.6 %
WBC: 6.5 10*3/uL (ref 3.8–10.8)

## 2020-06-17 LAB — LIPID PANEL
Cholesterol: 222 mg/dL — ABNORMAL HIGH (ref <200)
HDL: 46 mg/dL (ref >=40)
LDL Cholesterol (Calc): 146 mg/dL (calc) — ABNORMAL HIGH
Non-HDL Cholesterol (Calc): 176 mg/dL (calc) — ABNORMAL HIGH (ref <130)
Total CHOL/HDL Ratio: 4.8 (calc) (ref <5.0)
Triglycerides: 161 mg/dL — ABNORMAL HIGH (ref <150)

## 2020-06-17 LAB — HIV ANTIBODY (ROUTINE TESTING W REFLEX): HIV 1&2 Ab, 4th Generation: NONREACTIVE

## 2020-06-17 LAB — COMPLETE METABOLIC PANEL WITH GFR
AG Ratio: 1.3 (calc) (ref 1.0–2.5)
ALT: 10 U/L (ref 9–46)
AST: 16 U/L (ref 10–40)
Albumin: 4.1 g/dL (ref 3.6–5.1)
Alkaline phosphatase (APISO): 85 U/L (ref 36–130)
BUN: 14 mg/dL (ref 7–25)
CO2: 28 mmol/L (ref 20–32)
Calcium: 9.2 mg/dL (ref 8.6–10.3)
Chloride: 98 mmol/L (ref 98–110)
Creat: 1.02 mg/dL (ref 0.60–1.35)
GFR, Est African American: 105 mL/min/{1.73_m2} (ref >=60)
GFR, Est Non African American: 90 mL/min/{1.73_m2} (ref >=60)
Globulin: 3.2 g/dL (calc) (ref 1.9–3.7)
Glucose, Bld: 83 mg/dL (ref 65–99)
Potassium: 4.1 mmol/L (ref 3.5–5.3)
Sodium: 136 mmol/L (ref 135–146)
Total Bilirubin: 0.7 mg/dL (ref 0.2–1.2)
Total Protein: 7.3 g/dL (ref 6.1–8.1)

## 2020-06-17 LAB — HEPATITIS C ANTIBODY
Hepatitis C Ab: NONREACTIVE
SIGNAL TO CUT-OFF: 0.04 (ref <1.00)

## 2020-06-17 LAB — HEMOGLOBIN A1C
Hgb A1c MFr Bld: 5.6 % of total Hgb (ref <5.7)
Mean Plasma Glucose: 114 mg/dL
eAG (mmol/L): 6.3 mmol/L

## 2020-09-22 ENCOUNTER — Ambulatory Visit
Admission: EM | Admit: 2020-09-22 | Discharge: 2020-09-22 | Disposition: A | Discharge status: Home / Self Care | Payer: BC Managed Care – PPO | Attending: Emergency Medicine | Admitting: Emergency Medicine

## 2020-09-22 ENCOUNTER — Other Ambulatory Visit: Payer: Self-pay

## 2020-09-22 DIAGNOSIS — G459 Transient cerebral ischemic attack, unspecified: Secondary | ICD-10-CM

## 2020-09-22 NOTE — ED Provider Notes (Signed)
[facility]    CSN: [id number] Arrival date & time: 09/22/20  1906      History   Chief Complaint Chief Complaint  Patient presents with   Headache    HPI Jeffrey Cohen is a 45 y.o. male.   HPI  45 year old male here for evaluation of neurologic complaints.  Patient reports that when he woke up this morning he had a nosebleed.  He was able to get this to stop and went to work then around 2 to 2:30 PM he developed an episode of sweating that was quickly followed by dizziness and a headache.  At that time he also lost peripheral vision in his left eye.  He also states that he developed some slurred speech.  He denies any chest pain, shortness of breath, numbness, tingling, or weakness in any of extremities.  All of his symptoms have resolved.  He did drive himself home from work.  He works in the [employer].  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 Authorizing Provider  albuterol (VENTOLIN HFA) 108 (90 Base) MCG/ACT inhaler INHALE 2 PUFFS BY MOUTH EVERY 6  HOURS AS NEEDED FOR WHEEZING FOR SHORTNESS OF BREATH 06/07/20  Yes Danelle Berry, PA-C  losartan-hydrochlorothiazide (HYZAAR) 100-12.5 MG tablet Take 1 tablet by mouth daily. 06/16/20  Yes Danelle Berry, PA-C  nortriptyline (PAMELOR) 10 MG capsule Take by mouth. 10/06/19  Yes [provider]  rizatriptan (MAXALT) 10 MG tablet Take by mouth. 12/21/19  Yes [provider]    Family History Family History  Problem Relation Age of Onset   Cancer Mother        liver   Emphysema Mother    Hypertension Mother    Crohn's disease Mother    Hernia Mother    Diabetes Maternal Grandfather    Stroke Maternal Grandmother    Asthma Daughter    Heart disease Neg Hx    COPD Neg Hx     Social History Social History   Tobacco Use   Smoking status: Never   Smokeless tobacco: Never  Vaping Use   Vaping Use: Never used  Substance Use Topics   Alcohol use: Yes    Comment: occasional   Drug use: No     Allergies   Penicillins   Review of Systems Review of Systems  Constitutional:  Positive for diaphoresis. Negative for activity change, appetite change and fever.  HENT:  Positive for nosebleeds.   Eyes:  Positive for visual disturbance.  Respiratory:  Negative for shortness of breath.  Cardiovascular:  Negative for chest pain.  Gastrointestinal:  Negative for nausea and vomiting.  Neurological:  Positive for dizziness, speech difficulty and headaches. Negative for facial asymmetry, weakness and numbness.  Hematological: Negative.   Psychiatric/Behavioral: Negative.      Physical Exam Triage Vital Signs ED Triage Vitals  Enc Vitals Group     BP      Pulse      Resp      Temp      Temp src      SpO2      Weight      Height      Head Circumference      Peak Flow      Pain Score      Pain Loc      Pain Edu?      Excl. in GC?    No data found.  Updated Vital Signs BP (!) 143/93 (BP Location: Left Arm)   Pulse 70   Temp 98.6 F (37 C) (Oral)   Resp 18    Ht 6\' 1"  (1.854 m)   Wt 298 lb (135.2 kg)   SpO2 97%   BMI 39.32 kg/m   Visual Acuity Right Eye Distance:   Left Eye Distance:   Bilateral Distance:    Right Eye Near:   Left Eye Near:    Bilateral Near:     Physical Exam Vitals and nursing note reviewed.  Constitutional:      General: He is not in acute distress.    Appearance: Normal appearance. He is obese. He is not ill-appearing or diaphoretic.  HENT:     Head: Normocephalic and atraumatic.     Right Ear: Tympanic membrane, ear canal and external ear normal. There is no impacted cerumen.     Left Ear: Tympanic membrane, ear canal and external ear normal. There is no impacted cerumen.     Mouth/Throat:     Mouth: Mucous membranes are moist.     Pharynx: No posterior oropharyngeal erythema.  Cardiovascular:     Rate and Rhythm: Normal rate and regular rhythm.     Pulses: Normal pulses.     Heart sounds: Normal heart sounds. No murmur heard.   No gallop.  Pulmonary:     Effort: Pulmonary effort is normal.     Breath sounds: Normal breath sounds. No wheezing, rhonchi or rales.  Musculoskeletal:        General: No swelling or deformity. Normal range of motion.     Cervical back: Normal range of motion and neck supple.  Lymphadenopathy:     Cervical: No cervical adenopathy.  Skin:    General: Skin is warm and dry.     Capillary Refill: Capillary refill takes less than 2 seconds.     Findings: No erythema or rash.  Neurological:     General: No focal deficit present.     Mental Status: He is alert and oriented to person, place, and time.     Cranial Nerves: No cranial nerve deficit.     Sensory: No sensory deficit.     Motor: No weakness.     Coordination: Coordination normal.     Gait: Gait normal.     Deep Tendon Reflexes: Reflexes normal.  Psychiatric:        Mood and Affect: Mood normal.        Behavior: Behavior normal.        Thought Content: Thought content normal.  Judgment: Judgment normal.      UC Treatments / Results  Labs (all labs ordered are listed, but only abnormal results are displayed) Labs Reviewed - No data to display  EKG   Radiology No results found.  Procedures Procedures (including critical care time)  Medications Ordered in UC Medications - No data to display  Initial Impression / Assessment and Plan / UC Course  I have reviewed the triage vital signs and the nursing notes.  Pertinent labs & imaging results that were available during my care of the patient were reviewed by me and considered in my medical decision making (see chart for details).  Patient is a very pleasant, obese, 45 year old male here for evaluation of neurologic symptoms as outlined in the HPI above.  Patient's physical exam reveals cranial nerves II through XII intact.  Cardiopulmonary exam is benign.  Bilateral upper extremity strength grips are 5/5 and bilateral lower extremity strength is 5/5.  DTRs are equal levelly.  Patient has appropriate proprioception with finger-nose and heel-to-shin task completed without difficulty.  No pronator drift present on exam.  Patient does not diaphoretic, exhibiting slurred speech, and denies visual field deficits.  Patient's presentation concerning for TIA and this patient symptoms have resolved we will refer him to neurology and discharge him home with a work note to be off the next couple days.  Patient does have a history of hypertension and obesity for which he takes losartan and indicates he has not missed any of his doses.  Patient advised that if any of his symptoms return he needs to call 911 and go to the emergency department immediately.  Patient verbalized an understanding of same.   Final Clinical Impressions(s) / UC Diagnoses   Final diagnoses:  TIA (transient ischemic attack)     Discharge Instructions      Please go home and rest.  Continue to take your medication as prescribed, especially your Hyzaar.  I have made a  referral to neurology for an evaluation of your symptoms.  If you have a return of headache, dizziness, loss of vision, slurred speech, or sweating you need to call 911 and go to the ER for evaluation.     ED Prescriptions   None    PDMP not reviewed this encounter.   Becky Augusta, NP 09/22/20 1946

## 2020-09-22 NOTE — ED Triage Notes (Signed)
Patient states that this morning he woke up this morning with a nosebleed. States that around 230pm he states that he had an episode of lightheaded that led in to a headache. States that shortly after he had an episode of slurred speech. States that he has mostly has returned to normal since. States that he had no issues driving home from work.

## 2020-09-22 NOTE — Discharge Instructions (Addendum)
Please go home and rest.  Continue to take your medication as prescribed, especially your Hyzaar.  I have made a referral to neurology for an evaluation of your symptoms.  If you have a return of headache, dizziness, loss of vision, slurred speech, or sweating you need to call 911 and go to the ER for evaluation.

## 2020-09-30 ENCOUNTER — Other Ambulatory Visit: Payer: Self-pay | Admitting: Physician Assistant

## 2020-09-30 DIAGNOSIS — G459 Transient cerebral ischemic attack, unspecified: Secondary | ICD-10-CM

## 2020-10-07 ENCOUNTER — Other Ambulatory Visit: Payer: Self-pay

## 2020-10-07 ENCOUNTER — Ambulatory Visit
Admission: RE | Admit: 2020-10-07 | Discharge: 2020-10-07 | Disposition: A | Discharge status: Home / Self Care | Payer: BC Managed Care – PPO | Source: Ambulatory Visit | Attending: Physician Assistant | Admitting: Physician Assistant

## 2020-10-07 DIAGNOSIS — G459 Transient cerebral ischemic attack, unspecified: Secondary | ICD-10-CM

## 2020-11-02 ENCOUNTER — Other Ambulatory Visit: Payer: Self-pay | Admitting: Internal Medicine

## 2020-11-02 DIAGNOSIS — G459 Transient cerebral ischemic attack, unspecified: Secondary | ICD-10-CM

## 2020-11-02 DIAGNOSIS — I208 Other forms of angina pectoris: Secondary | ICD-10-CM

## 2020-11-10 ENCOUNTER — Other Ambulatory Visit: Payer: BC Managed Care – PPO

## 2020-11-15 ENCOUNTER — Encounter
Admission: RE | Admit: 2020-11-15 | Discharge: 2020-11-15 | Disposition: A | Discharge status: Home / Self Care | Payer: BC Managed Care – PPO | Source: Ambulatory Visit | Attending: Internal Medicine | Admitting: Internal Medicine

## 2020-11-15 ENCOUNTER — Other Ambulatory Visit: Payer: Self-pay

## 2020-11-15 DIAGNOSIS — I208 Other forms of angina pectoris: Secondary | ICD-10-CM | POA: Insufficient documentation

## 2020-11-15 DIAGNOSIS — G459 Transient cerebral ischemic attack, unspecified: Secondary | ICD-10-CM | POA: Diagnosis present

## 2020-11-15 MED ORDER — REGADENOSON 0.4 MG/5ML IV SOLN
0.4000 mg | Freq: Once | INTRAVENOUS | Status: AC
  Administered 2020-11-15: 0.4 mg via INTRAVENOUS

## 2020-11-15 MED ORDER — TECHNETIUM TC 99M TETROFOSMIN IV KIT
30.0000 | PACK | Freq: Once | INTRAVENOUS | Status: AC | PRN
  Administered 2020-11-15: 30.9 via INTRAVENOUS

## 2020-11-16 ENCOUNTER — Encounter
Admission: RE | Admit: 2020-11-16 | Discharge: 2020-11-16 | Disposition: A | Discharge status: Home / Self Care | Payer: BC Managed Care – PPO | Source: Ambulatory Visit | Attending: Internal Medicine | Admitting: Internal Medicine

## 2020-11-16 LAB — NM MYOCAR MULTI W/SPECT W/WALL MOTION / EF
Estimated workload: 1
Exercise duration (sec): 56 s
LV dias vol: 153 mL (ref 62–150)
LV sys vol: 71 mL
MPHR: 177 {beats}/min
Nuc Stress EF: 54 %
Peak HR: 104 {beats}/min
Percent HR: 57 %
RPE: 4
Rest HR: 75 {beats}/min
Rest Nuclear Isotope Dose: 30.3 mCi
SDS: 0
SRS: 13
SSS: 3
ST Depression (mm): 0 mm
Stress Nuclear Isotope Dose: 30.9 mCi
TID: 0.93

## 2020-11-16 MED ORDER — TECHNETIUM TC 99M TETROFOSMIN IV KIT
30.0000 | PACK | Freq: Once | INTRAVENOUS | Status: AC
  Administered 2020-11-16: 30.31 via INTRAVENOUS

## 2020-12-17 ENCOUNTER — Ambulatory Visit: Payer: BC Managed Care – PPO | Admitting: Family Medicine

## 2020-12-21 ENCOUNTER — Telehealth (INDEPENDENT_AMBULATORY_CARE_PROVIDER_SITE_OTHER): Payer: BC Managed Care – PPO | Admitting: Nurse Practitioner

## 2020-12-21 ENCOUNTER — Other Ambulatory Visit: Payer: Self-pay

## 2020-12-21 ENCOUNTER — Encounter: Payer: Self-pay | Admitting: Nurse Practitioner

## 2020-12-21 VITALS — Temp 99.0°F

## 2020-12-21 DIAGNOSIS — U071 COVID-19: Secondary | ICD-10-CM

## 2020-12-21 MED ORDER — BENZONATATE 100 MG PO CAPS: 100.0000 mg | ORAL_CAPSULE | Freq: Three times a day (TID) | ORAL | 0 refills | Status: DC | PRN

## 2020-12-21 NOTE — Progress Notes (Signed)
Name: Jeffrey Cohen   MRN: [id number]    DOB: 11-30-1975   Date:12/21/2020       Progress Note  Subjective  Chief Complaint  Chief Complaint  Patient presents with   Covid Positive    Fever, cough, fatigue,     I connected with  Silva Bandy  on 12/21/20 at  4:00 PM EDT by a video enabled telemedicine application and verified that I am speaking with the correct person using two identifiers.  I discussed the limitations of evaluation and management by telemedicine and the availability of in person appointments. The patient expressed understanding and agreed to proceed with a virtual visit  Staff also discussed with the patient that there may be a patient responsible charge related to this service. Patient Location: home Provider Location: [facility] Additional Individuals present: wife  HPI  Covid-19: Symptoms started Monday 12/13/20, tested positive for Covid on 12/14/20.  Symptoms include fever, cough, fatigue,and nasal congestion.  He is still running a fever 99-101. He denies any shortness of breath.  He has not been taking anything for the symptoms.  Discussed OTC treatments for symptoms and will send in prescription for tessalon perls.  Discussed concerning signs to watch out for to seek emergency care.  Discussed CDC guidelines for quarantine.  Discussed pushing fluids. He is agreeable to plan.   Patient Active Problem List   Diagnosis Date Noted   Mild intermittent asthma without complication 06/16/2020   Chronic venous insufficiency 01/31/2017   Medication monitoring encounter 10/18/2016   Hyperlipidemia 10/18/2016   Acanthosis nigricans 04/14/2016   Preventative health care 04/14/2016   Eczema 04/14/2016   Acrochordon 04/14/2016   Pain in limb 12/24/2015   Essential hypertension, benign 03/02/2015   Class 3 severe obesity with body mass index (BMI) of 40.0 to 44.9 in adult [facility]) 03/02/2015   Varicose veins of both lower extremities with complications 02/24/2015     Social History   Tobacco Use   Smoking status: Never   Smokeless tobacco: Never  Substance Use Topics   Alcohol use: Yes    Comment: occasional     Current Outpatient Medications:    albuterol (VENTOLIN HFA) 108 (90 Base) MCG/ACT inhaler, INHALE 2 PUFFS BY MOUTH EVERY 6 HOURS AS NEEDED FOR WHEEZING FOR SHORTNESS OF BREATH, Disp: 18 g, Rfl: 0   atorvastatin (LIPITOR) 20 MG tablet, Take 20 mg by mouth daily., Disp: , Rfl:    losartan-hydrochlorothiazide (HYZAAR) 100-12.5 MG tablet, Take 1 tablet by mouth daily., Disp: 90 tablet, Rfl: 3   metoprolol succinate (TOPROL-XL) 25 MG 24 hr tablet, Take 25 mg by mouth daily., Disp: , Rfl:    nortriptyline (PAMELOR) 10 MG capsule, Take by mouth., Disp: , Rfl:    rizatriptan (MAXALT) 10 MG tablet, Take by mouth., Disp: , Rfl:    UBRELVY 50 MG TABS, Take 2 tablets by mouth 2 (two) times daily. (Patient not taking: Reported on 12/21/2020), Disp: , Rfl:   Allergies  Allergen Reactions   Penicillins     I personally reviewed active problem list, medication list, allergies with the patient/caregiver today.  ROS  Constitutional: Positive for fever, negative for weight change.  Respiratory: Positive for cough, negative for shortness of breath.   Cardiovascular: Negative for chest pain or palpitations.  Gastrointestinal: Negative for abdominal pain, no bowel changes.  Musculoskeletal: Negative for gait problem or joint swelling.  Skin: Negative for rash.  Neurological: Negative for dizziness or headache.  No other specific complaints in a  complete review of systems (except as listed in HPI above).   Objective  Virtual encounter, vitals not obtained.  There is no height or weight on file to calculate BMI.  Nursing Note and Vital Signs reviewed.  Physical Exam  Awake, alert and oriented.  Speaking in complete sentences.   No results found for this or any previous visit (from the past 72 hour(s)).  Assessment & Plan  1.  COVID-19 -push fluids - discussed OTC treatments for symptoms - benzonatate (TESSALON) 100 MG capsule; Take 1 capsule (100 mg total) by mouth 3 (three) times daily as needed for cough.  Dispense: 30 capsule; Refill: 0   -Red flags and when to present for emergency care or RTC including fever >101.37F, chest pain, shortness of breath, new/worsening/un-resolving symptoms, reviewed with patient at time of visit. Follow up and care instructions discussed and provided in AVS. - I discussed the assessment and treatment plan with the patient. The patient was provided an opportunity to ask questions and all were answered. The patient agreed with the plan and demonstrated an understanding of the instructions.  I provided 15 minutes of non-face-to-face time during this encounter.  Berniece Salines, FNP

## 2021-01-19 ENCOUNTER — Ambulatory Visit: Payer: BC Managed Care – PPO | Admitting: Family Medicine

## 2021-01-19 ENCOUNTER — Encounter: Payer: Self-pay | Admitting: Family Medicine

## 2021-01-19 ENCOUNTER — Other Ambulatory Visit: Payer: Self-pay

## 2021-01-19 VITALS — BP 124/76 | HR 97 | Temp 98.1°F | Resp 16 | Ht 73.0 in | Wt 315.1 lb

## 2021-01-19 DIAGNOSIS — G4733 Obstructive sleep apnea (adult) (pediatric): Secondary | ICD-10-CM | POA: Diagnosis not present

## 2021-01-19 DIAGNOSIS — E7849 Other hyperlipidemia: Secondary | ICD-10-CM

## 2021-01-19 DIAGNOSIS — J4520 Mild intermittent asthma, uncomplicated: Secondary | ICD-10-CM

## 2021-01-19 DIAGNOSIS — L308 Other specified dermatitis: Secondary | ICD-10-CM

## 2021-01-19 DIAGNOSIS — I10 Essential (primary) hypertension: Secondary | ICD-10-CM | POA: Diagnosis not present

## 2021-01-19 DIAGNOSIS — R519 Headache, unspecified: Secondary | ICD-10-CM

## 2021-01-19 DIAGNOSIS — Z6841 Body Mass Index (BMI) 40.0 and over, adult: Secondary | ICD-10-CM

## 2021-01-19 NOTE — Assessment & Plan Note (Addendum)
Continue to follow with Neuro, Cardiology for stroke risk reduction and TIA w/u. Recommend compliance with CPAP and weight loss.

## 2021-01-19 NOTE — Assessment & Plan Note (Signed)
Recommend weight loss through diet and exercise. 

## 2021-01-19 NOTE — Progress Notes (Signed)
   SUBJECTIVE:   CHIEF COMPLAINT / HPI:   Hypertension, OSA: - Medications: losartan-HCTZ, metoprolol - Compliance: good - Checking BP at home: occasionally, 120s SBP - Denies any medication SEs, or symptoms of hypotension - some LE edema at work - Exercise: none formally - does not wear CPAP  HLD - medications: lipitor - compliance: good - medication SEs: none  Asthma, Eczema - Medications: albuterol PRN - Taking: hasn't needed  - Common triggers: smoke - ED visits/hospitalization in the last 6 months: 0 - Current symptoms: none - no recent eczema flares, unsure of trigger  Possible TIA, Headache - UC visit 7/6 for self-resolved episode of dizziness, headache, sweating with slurred speech. Evaluated by Neurology, obtained brain MRI, normal per patient report. Evaluated by Cardiology with LVEF mildly reduced on ECHO otherwise no abnormalities, recommending sleep study. Per patient report, has CPAP but doesn't use it. Back at work, no restrictions. Recently started on ASA, statin (been on about a month), metoprolol. Has taken ulbrelvy once since UC visit for headache, had when woke up in the morning, no other associated symptoms. Headache resolved with Bernita Raisin. Has Cardiology and Neuro f/u in a few months.   OBJECTIVE:   BP 124/76   Pulse 97   Temp 98.1 F (36.7 C)   Resp 16   Ht 6\' 1"  (1.854 m)   Wt (!) 315 lb 1.6 oz (142.9 kg)   SpO2 100%   BMI 41.57 kg/m   Gen: well appearing, obese, in NAD Card: RRR Lungs: CTAB Ext: WWP, no edema   ASSESSMENT/PLAN:   Essential hypertension, benign Doing well on current regimen, no changes made today.  Mild intermittent asthma without complication Doing well on current regimen, no changes made today.  OSA (obstructive sleep apnea) Recommend CPAP compliance.  Eczema Currently asymptomatic, no recent flares. Continue to monitor.  Class 3 severe obesity with body mass index (BMI) of 40.0 to 44.9 in adult [facility]) Recommend  weight loss through diet and exercise.   Hyperlipidemia Tolerant of statin, check labs today.  Headache disorder Continue to follow with Neuro, Cardiology for stroke risk reduction and TIA w/u. Recommend compliance with CPAP and weight loss.     [facility], DO

## 2021-01-19 NOTE — Assessment & Plan Note (Signed)
Tolerant of statin, check labs today.

## 2021-01-19 NOTE — Assessment & Plan Note (Signed)
Currently asymptomatic, no recent flares. Continue to monitor.

## 2021-01-19 NOTE — Assessment & Plan Note (Signed)
Recommend CPAP compliance.

## 2021-01-19 NOTE — Patient Instructions (Signed)
It was great to see you!  Our plans for today:  - No changes to your medications. - I recommend wearing your CPAP every night.  We are checking some labs today, we will release these results to your MyChart.  Take care and seek immediate care sooner if you develop any concerns.   Dr. Ky Barban  Look for opportunities to move your body throughout your day:  Never lie down when you can sit; never sit when you can stand; never stand when you can pace.  Moving your body throughout the day is just as important as the 30 or 60 minutes of exercise at the gym!  Get social Get active with your friends instead of going out to eat. Go for a hike, walk around the mall, or play an exercise-themed video game.   Move more at work Fit more activity into the workday. Stand during phone calls, use a printer farther from your desk, and get up to stretch each hour.    Do something new Develop a new skill to kick-start your motivation. Sign up for a class to learn how to [employer], surf, do tai chi, or play a sport.    Keep cool in the pool Don't like to sweat? Hit the local community pool for a swim, water polo, or water aerobics class to stay cool while exercising.    Stay on track Use a fitness tracker (FITBIT, Fitness Pal mobile app) to track your activity and provide motivation to reach your goals.

## 2021-01-19 NOTE — Assessment & Plan Note (Signed)
Doing well on current regimen, no changes made today. 

## 2021-01-20 LAB — LIPID PANEL
Cholesterol: 158 mg/dL (ref <200)
HDL: 45 mg/dL (ref >=40)
LDL Cholesterol (Calc): 92 mg/dL (calc)
Non-HDL Cholesterol (Calc): 113 mg/dL (calc) (ref <130)
Total CHOL/HDL Ratio: 3.5 (calc) (ref <5.0)
Triglycerides: 116 mg/dL (ref <150)

## 2021-01-20 LAB — BASIC METABOLIC PANEL
BUN: 11 mg/dL (ref 7–25)
CO2: 30 mmol/L (ref 20–32)
Calcium: 9.4 mg/dL (ref 8.6–10.3)
Chloride: 102 mmol/L (ref 98–110)
Creat: 0.94 mg/dL (ref 0.60–1.29)
Glucose, Bld: 77 mg/dL (ref 65–99)
Potassium: 4.2 mmol/L (ref 3.5–5.3)
Sodium: 139 mmol/L (ref 135–146)

## 2021-05-11 ENCOUNTER — Telehealth: Payer: Self-pay

## 2021-05-11 NOTE — Telephone Encounter (Signed)
Patient requested second pair of orthotics. Order sent to central fabrication based on previous casts

## 2021-06-03 ENCOUNTER — Telehealth: Payer: Self-pay | Admitting: Podiatry

## 2021-06-03 NOTE — Telephone Encounter (Signed)
Lvm to schedule p/u of orthotics  ?

## 2021-06-27 ENCOUNTER — Telehealth: Payer: Self-pay | Admitting: Physician Assistant

## 2021-06-27 ENCOUNTER — Encounter: Payer: Self-pay | Admitting: Emergency Medicine

## 2021-06-27 ENCOUNTER — Ambulatory Visit
Admission: EM | Admit: 2021-06-27 | Discharge: 2021-06-27 | Disposition: A | Discharge status: Home / Self Care | Payer: BC Managed Care – PPO | Attending: Physician Assistant | Admitting: Physician Assistant

## 2021-06-27 ENCOUNTER — Other Ambulatory Visit: Payer: Self-pay

## 2021-06-27 DIAGNOSIS — U071 COVID-19: Secondary | ICD-10-CM | POA: Insufficient documentation

## 2021-06-27 DIAGNOSIS — J45901 Unspecified asthma with (acute) exacerbation: Secondary | ICD-10-CM | POA: Insufficient documentation

## 2021-06-27 DIAGNOSIS — R051 Acute cough: Secondary | ICD-10-CM | POA: Insufficient documentation

## 2021-06-27 DIAGNOSIS — B349 Viral infection, unspecified: Secondary | ICD-10-CM | POA: Insufficient documentation

## 2021-06-27 DIAGNOSIS — R062 Wheezing: Secondary | ICD-10-CM | POA: Diagnosis present

## 2021-06-27 LAB — RESP PANEL BY RT-PCR (FLU A&B, COVID) ARPGX2
Influenza A by PCR: NEGATIVE
Influenza B by PCR: NEGATIVE
SARS Coronavirus 2 by RT PCR: POSITIVE — AB

## 2021-06-27 MED ORDER — PSEUDOEPH-BROMPHEN-DM 30-2-10 MG/5ML PO SYRP: 10.0000 mL | ORAL_SOLUTION | Freq: Four times a day (QID) | ORAL | 0 refills | Status: AC | PRN

## 2021-06-27 MED ORDER — NIRMATRELVIR/RITONAVIR (PAXLOVID)TABLET: 3.0000 | ORAL_TABLET | Freq: Two times a day (BID) | ORAL | 0 refills | Status: AC

## 2021-06-27 MED ORDER — PREDNISONE 20 MG PO TABS: 40.0000 mg | ORAL_TABLET | Freq: Every day | ORAL | 0 refills | Status: AC

## 2021-06-27 NOTE — Telephone Encounter (Signed)
Called and discussed result with patient.  Positive for COVID-19.  Sent Paxlovid since he has normal kidney function on labs 5 months ago.  Advised not to take the atorvastatin for 1 week and advised not to take Ubrelvy when taking Paxlovid.  Reviewed current CDC guidelines, isolation protocol and ED precautions. ?

## 2021-06-27 NOTE — ED Provider Notes (Signed)
?Central ? ? ? ?CSN: [id number] ?Arrival date & time: 06/27/21  1103 ? ? ?  ? ?History   ?Chief Complaint ?Chief Complaint  ?Patient presents with  ? Cough  ?  Chest and head congestion, difficulty breathing due to cough and congestion. Cough. Fatigue. - Entered by patient  ? ? ?HPI ?Hendrixx Mcquown is a 46 y.o. male presenting for productive cough that began yesterday.  Patient also reports sore throat and fatigue as well as chest heaviness/tightness and shortness of breath with wheezing.  He has a history of asthma.  Has not used his asthma inhaler.  Patient says his father-in-law is also sick but denies knowing what he is sick with.  No known COVID exposure.  Patient has taken over-the-counter cough medication for symptoms.  Other medical history significant for hypertension, hyperlipidemia and obesity. ? ?HPI ? ?Past Medical History:  ?Diagnosis Date  ? Asthma   ? Essential hypertension, benign 03/02/2015  ? Obesity 03/02/2015  ? ? ?Patient Active Problem List  ? Diagnosis Date Noted  ? Mild intermittent asthma without complication [id number]  ? Bleeding nose 05/05/2019  ? Headache disorder 05/05/2019  ? Obesity, Class III, BMI 40-49.9 (morbid obesity) ([place]) 05/05/2019  ? OSA (obstructive sleep apnea) 05/05/2019  ? Photophobia 05/05/2019  ? Chronic venous insufficiency 01/31/2017  ? Hyperlipidemia 10/18/2016  ? Acanthosis nigricans 04/14/2016  ? Eczema 04/14/2016  ? Acrochordon 04/14/2016  ? Pain in limb 12/24/2015  ? Essential hypertension, benign 03/02/2015  ? Class 3 severe obesity with body mass index (BMI) of 40.0 to 44.9 in adult [facility]) 03/02/2015  ? Varicose veins of both lower extremities with complications [id number]  ? ? ?Past Surgical History:  ?Procedure Laterality Date  ? TENDON REPAIR    ? left thumb  ? VEIN LIGATION AND STRIPPING Bilateral 10/29/2015  ? [id number]  ? ? ? ? ? ?Home Medications   ? ?Prior to Admission medications   ?Medication Sig Start Date End Date Taking?  Authorizing Provider  ?brompheniramine-pseudoephedrine-DM 30-2-10 MG/5ML syrup Take 10 mLs by mouth 4 (four) times daily as needed for up to 7 days. 06/27/21 07/04/21 Yes Laurene Footman B, PA-C  ?predniSONE (DELTASONE) 20 MG tablet Take 2 tablets (40 mg total) by mouth daily for 5 days. 06/27/21 07/02/21 Yes Laurene Footman B, PA-C  ?albuterol (VENTOLIN HFA) 108 (90 Base) MCG/ACT inhaler INHALE 2 PUFFS BY MOUTH EVERY 6 HOURS AS NEEDED FOR WHEEZING FOR SHORTNESS OF BREATH 06/07/20   Delsa Grana, PA-C  ?atorvastatin (LIPITOR) 20 MG tablet Take 20 mg by mouth daily. 12/07/20   [provider]  ?losartan-hydrochlorothiazide (HYZAAR) 100-12.5 MG tablet Take 1 tablet by mouth daily. 06/16/20   Delsa Grana, PA-C  ?metoprolol succinate (TOPROL-XL) 25 MG 24 hr tablet Take 25 mg by mouth daily. 12/07/20   [provider]  ?nortriptyline (PAMELOR) 10 MG capsule Take by mouth. 10/06/19   [provider]  ?Ubrogepant 50 MG TABS Take 50 mg by mouth. Take 50-100mg  at headache onset. Can repeat after 2 hours if needed. Do not exceed 200mg  in 24 hours.    [provider]  ? ? ?Family History ?Family History  ?Problem Relation Age of Onset  ? Cancer Mother   ?     liver  ? Emphysema Mother   ? Hypertension Mother   ? Crohn's disease Mother   ? Hernia Mother   ? Diabetes Maternal Grandfather   ? Stroke Maternal Grandmother   ? Asthma Daughter   ?  Heart disease Neg Hx   ? COPD Neg Hx   ? ? ?Social History ?Social History  ? ?Tobacco Use  ? Smoking status: Never  ? Smokeless tobacco: Never  ?Vaping Use  ? Vaping Use: Never used  ?Substance Use Topics  ? Alcohol use: Yes  ?  Comment: occasional  ? Drug use: No  ? ? ? ?Allergies   ?Penicillins ? ? ?Review of Systems ?Review of Systems  ?Constitutional:  Positive for chills and fatigue. Negative for fever.  ?HENT:  Positive for congestion, rhinorrhea and sore throat. Negative for sinus pressure and sinus pain.   ?Respiratory:  Positive for cough, chest tightness,  shortness of breath and wheezing.   ?Gastrointestinal:  Negative for abdominal pain, diarrhea, nausea and vomiting.  ?Musculoskeletal:  Positive for myalgias.  ?Neurological:  Negative for weakness, light-headedness and headaches.  ?Hematological:  Negative for adenopathy.  ? ? ?Physical Exam ?Triage Vital Signs ?ED Triage Vitals  ?Enc Vitals Group  ?   BP   ?   Pulse   ?   Resp   ?   Temp   ?   Temp src   ?   SpO2   ?   Weight   ?   Height   ?   Head Circumference   ?   Peak Flow   ?   Pain Score   ?   Pain Loc   ?   Pain Edu?   ?   Excl. in [place]?   ? ?No data found. ? ?Updated Vital Signs ?BP 132/87 (BP Location: Left Arm)   Pulse 69   Temp 98.2 ?F (36.8 ?C) (Oral)   Resp 20   SpO2 100%  ? ? ?Physical Exam ?Vitals and nursing note reviewed.  ?Constitutional:   ?   General: He is not in acute distress. ?   Appearance: Normal appearance. He is well-developed. He is obese. He is ill-appearing.  ?HENT:  ?   Head: Normocephalic and atraumatic.  ?   Nose: Congestion present.  ?   Mouth/Throat:  ?   Mouth: Mucous membranes are moist.  ?   Pharynx: Oropharynx is clear. Posterior oropharyngeal erythema (mild with clear PND) present.  ?Eyes:  ?   General: No scleral icterus. ?   Conjunctiva/sclera: Conjunctivae normal.  ?Cardiovascular:  ?   Rate and Rhythm: Normal rate and regular rhythm.  ?   Heart sounds: Normal heart sounds.  ?Pulmonary:  ?   Effort: Pulmonary effort is normal. No respiratory distress.  ?   Breath sounds: Wheezing (diffuse wheezes throughout all lung fields) present.  ?Musculoskeletal:  ?   Cervical back: Neck supple.  ?Skin: ?   General: Skin is warm and dry.  ?   Capillary Refill: Capillary refill takes less than 2 seconds.  ?Neurological:  ?   General: No focal deficit present.  ?   Mental Status: He is alert. Mental status is at baseline.  ?   Motor: No weakness.  ?   Coordination: Coordination normal.  ?   Gait: Gait normal.  ?Psychiatric:     ?   Mood and Affect: Mood normal.     ?   Behavior:  Behavior normal.     ?   Thought Content: Thought content normal.  ? ? ? ?UC Treatments / Results  ?Labs ?(all labs ordered are listed, but only abnormal results are displayed) ?Labs Reviewed  ?RESP PANEL BY RT-PCR (FLU A&B, COVID) [id number]  ? ? ?EKG ? ? ?  Radiology ?No results found. ? ?Procedures ?Procedures (including critical care time) ? ?Medications Ordered in UC ?Medications - No data to display ? ?Initial Impression / Assessment and Plan / UC Course  ?I have reviewed the triage vital signs and the nursing notes. ? ?Pertinent labs & imaging results that were available during my care of the patient were reviewed by me and considered in my medical decision making (see chart for details). ? ?46 year old male presenting for productive cough that started yesterday.  Other symptoms include fatigue, chills, congestion, sore throat, body aches, wheezing, chest tightness and shortness of breath.  Vitals normal and stable.  Patient ill-appearing but nontoxic.  He does have congestion on exam as well as erythema posterior pharynx with clear postnasal drainage.  No respiratory distress.  Diffuse wheezing throughout all lung fields. ? ?Respiratory panel obtained.  Advised patient I will contact him with the results if he is positive for COVID-19.  Reviewed current CDC guidelines, isolation protocol and ED precautions if COVID-positive.  Will treat with COVID antiviral medicine if positive given his risk factors. ? ?Suspect patient has a viral illness and asthma exacerbation.  Advised him its important for him to start using his inhaler that he has at home.  Offered to renew it but he says he does not need one.  Sent prednisone to pharmacy as well as Bromfed-DM.  Reviewed return and ER precautions. ? ?Positive COVID-19.  Sent Paxlovid to pharmacy and notify patient of result.  Advised holding Lipitor for 1 week and not taking Ubrelvy while taking Paxlovid. ? ?Final Clinical Impressions(s) / UC Diagnoses  ? ?Final diagnoses:   ?Viral illness  ?Asthma with acute exacerbation, unspecified asthma severity, unspecified whether persistent  ?Acute cough  ?Wheezing  ? ? ? ?Discharge Instructions   ? ?  ?-I will call you if your COVID test is p

## 2021-06-27 NOTE — Discharge Instructions (Signed)
-  I will call you if your COVID test is positive and sent antiviral medicine.  If you are positive for COVID you need to isolate 5 days and wear mask 5 days.  Today would be the first day. ?- I think a virus flared up your asthma.  Use your at home inhaler for wheezing and shortness of breath.  I sent prednisone to the pharmacy.  I have also sent a cough medicine.  Increase rest and fluids. ?- If you develop a fever or worsening symptoms you should be reevaluated. ?- If you have breathing difficulty not relieved by use of your inhalers you should go to the emergency department. ?

## 2021-06-27 NOTE — ED Triage Notes (Signed)
Cough started yesterday.  Reports spending a lot of time in the rain.  Chest soreness with coughing.  Has productive cough today of thick, clear phlegm.  Reports wheezing, chills, but denies fever ?

## 2021-07-19 ENCOUNTER — Encounter: Payer: Self-pay | Admitting: Nurse Practitioner

## 2021-07-19 ENCOUNTER — Ambulatory Visit: Payer: BC Managed Care – PPO | Admitting: Nurse Practitioner

## 2021-07-19 ENCOUNTER — Other Ambulatory Visit: Payer: Self-pay

## 2021-07-19 VITALS — BP 128/78 | HR 99 | Temp 98.0°F | Resp 18 | Ht 73.0 in | Wt 318.1 lb

## 2021-07-19 DIAGNOSIS — I10 Essential (primary) hypertension: Secondary | ICD-10-CM | POA: Diagnosis not present

## 2021-07-19 DIAGNOSIS — M25561 Pain in right knee: Secondary | ICD-10-CM

## 2021-07-19 DIAGNOSIS — J4520 Mild intermittent asthma, uncomplicated: Secondary | ICD-10-CM

## 2021-07-19 DIAGNOSIS — E7849 Other hyperlipidemia: Secondary | ICD-10-CM

## 2021-07-19 DIAGNOSIS — G4733 Obstructive sleep apnea (adult) (pediatric): Secondary | ICD-10-CM

## 2021-07-19 NOTE — Progress Notes (Signed)
? ?BP 128/78   Pulse 99   Temp 98 ?F (36.7 ?C) (Oral)   Resp 18   Ht 6\' 1"  (1.854 m)   Wt (!) 318 lb 1.6 oz (144.3 kg)   SpO2 96%   BMI 41.97 kg/m?   ? ?Subjective:  ? ? Patient ID: Jeffrey Cohen, male    DOB: 1976-02-09, 46 y.o.   MRN: [id number] ? ?HPI: ?Jeffrey Cohen is a 46 y.o. male ? ?Chief Complaint  ?Patient presents with  ? Hypertension  ? Hyperlipidemia  ?  6 month follow up  ? ?Hypertension: He does not check his blood pressure at home his blood pressure today is 128/78.  He denies any chest pain, shortness of breath, blurred vision or headaches.  He currently takes metoprolol 25 mg daily, and losartan hydrochlorothiazide 100 -12.5 mg daily.  We will continue with current treatment. ? ?Hyperlipidemia: LDL was 92 on 01/19/2021.  He currently takes atorvastatin 20 mg daily.  He denies any myalgia.  We will get labs today. ? ?Obesity: His current weight is 318 pounds with a BMI 41.97.  Recommend increasing physical activity and watching portion size. ? ?Asthma: He uses his albuterol inhaler for his asthma occasionally.  He says he has not had to use it in a while. ? ?OSA: He says he does have a CPAP machine at home he often falls asleep before putting it on.  He says he is going to work on using it more. ? ?Right knee pain: He says about a year ago he fell at work landing on his right knee.  He says that he thought the pain would go away.  The pain has continued.  He denies any decreased range of motion.  But he says it is painful.  He says pain is worse when something brushes up against it or with when its about to rain. we will get x-ray.  And place referral for orthopedics. ? ?Relevant past medical, surgical, family and social history reviewed and updated as indicated. Interim medical history since our last visit reviewed. ?Allergies and medications reviewed and updated. ? ?Review of Systems ?Constitutional: Negative for fever or weight change.  ?Respiratory: Negative for cough and  shortness of breath.   ?Cardiovascular: Negative for chest pain or palpitations.  ?Gastrointestinal: Negative for abdominal pain, no bowel changes.  ?Musculoskeletal: Negative for gait problem or joint swelling.  Positive right knee pain ?Skin: Negative for rash.  ?Neurological: Negative for dizziness or headache.  ?No other specific complaints in a complete review of systems (except as listed in HPI above).  ? ?   ?Objective:  ?  ?BP 128/78   Pulse 99   Temp 98 ?F (36.7 ?C) (Oral)   Resp 18   Ht 6\' 1"  (1.854 m)   Wt (!) 318 lb 1.6 oz (144.3 kg)   SpO2 96%   BMI 41.97 kg/m?   ?Wt Readings from Last 3 Encounters:  ?07/19/21 (!) 318 lb 1.6 oz (144.3 kg)  ?01/19/21 (!) 315 lb 1.6 oz (142.9 kg)  ?09/22/20 298 lb (135.2 kg)  ?  ?Physical Exam ? ?Constitutional: Patient appears well-developed and well-nourished. Obese  No distress.  ?HEENT: head atraumatic, normocephalic, pupils equal and reactive to light,  neck supple ?Cardiovascular: Normal rate, regular rhythm and normal heart sounds.  No murmur heard. No BLE edema. ?Pulmonary/Chest: Effort normal and breath sounds normal. No respiratory distress. ?Abdominal: Soft.  There is no tenderness. ?MSK: No decreased range of motion in right knee,  slight swelling noted ?Psychiatric: Patient has a normal mood and affect. behavior is normal. Judgment and thought content normal.  ?Results for orders placed or performed during the hospital encounter of 06/27/21  ?Resp Panel by RT-PCR (Flu A&B, Covid) Nasopharyngeal Swab  ? Specimen: Nasopharyngeal Swab; Nasopharyngeal(NP) swabs in vial transport medium  ?Result Value Ref Range  ? SARS Coronavirus 2 by RT PCR POSITIVE (A) NEGATIVE  ? Influenza A by PCR NEGATIVE NEGATIVE  ? Influenza B by PCR NEGATIVE NEGATIVE  ? ?   ?Assessment & Plan:  ? ?1. Essential hypertension, benign ?Need to take metoprolol and losartan-hydrochlorothiazide. ?- CBC with Differential/Platelet ?- COMPLETE METABOLIC PANEL WITH GFR ? ?2. Other  hyperlipidemia ?Continue to take atorvastatin daily ?- COMPLETE METABOLIC PANEL WITH GFR ?- Lipid panel ? ?3. Obesity, Class III, BMI 40-49.9 (morbid obesity) ([facility]) ?Work on increasing physical activity, healthy food choices and watching portion size ?- CBC with Differential/Platelet ?- COMPLETE METABOLIC PANEL WITH GFR ?- Lipid panel ? ?4. Mild intermittent asthma without complication ?Continue to use albuterol inhaler as needed ?Avoid triggers ?5. OSA (obstructive sleep apnea) ?Work on using CPAP machine more regularly ? ?6. Acute pain of right knee ? ?- DG Knee Complete 4 Views Right; Future ?- Ambulatory referral to Orthopedic Surgery  ? ?Follow up plan: ?Return in about 6 months (around 01/19/2022) for follow up. ? ? ? ? ? ?

## 2021-07-20 LAB — COMPLETE METABOLIC PANEL WITH GFR
AG Ratio: 1.3 (calc) (ref 1.0–2.5)
ALT: 9 U/L (ref 9–46)
AST: 15 U/L (ref 10–40)
Albumin: 4 g/dL (ref 3.6–5.1)
Alkaline phosphatase (APISO): 104 U/L (ref 36–130)
BUN: 11 mg/dL (ref 7–25)
CO2: 31 mmol/L (ref 20–32)
Calcium: 9.4 mg/dL (ref 8.6–10.3)
Chloride: 100 mmol/L (ref 98–110)
Creat: 0.99 mg/dL (ref 0.60–1.29)
Globulin: 3.2 g/dL (calc) (ref 1.9–3.7)
Glucose, Bld: 100 mg/dL — ABNORMAL HIGH (ref 65–99)
Potassium: 4.4 mmol/L (ref 3.5–5.3)
Sodium: 139 mmol/L (ref 135–146)
Total Bilirubin: 0.6 mg/dL (ref 0.2–1.2)
Total Protein: 7.2 g/dL (ref 6.1–8.1)
eGFR: 97 mL/min/{1.73_m2} (ref >=60)

## 2021-07-20 LAB — CBC WITH DIFFERENTIAL/PLATELET
Absolute Monocytes: 554 cells/uL (ref 200–950)
Basophils Absolute: 21 cells/uL (ref 0–200)
Basophils Relative: 0.3 %
Eosinophils Absolute: 270 cells/uL (ref 15–500)
Eosinophils Relative: 3.8 %
HCT: 45.4 % (ref 38.5–50.0)
Hemoglobin: 15.1 g/dL (ref 13.2–17.1)
Lymphs Abs: 2719 cells/uL (ref 850–3900)
MCH: 28.1 pg (ref 27.0–33.0)
MCHC: 33.3 g/dL (ref 32.0–36.0)
MCV: 84.5 fL (ref 80.0–100.0)
MPV: 9.8 fL (ref 7.5–12.5)
Monocytes Relative: 7.8 %
Neutro Abs: 3536 cells/uL (ref 1500–7800)
Neutrophils Relative %: 49.8 %
Platelets: 330 10*3/uL (ref 140–400)
RBC: 5.37 10*6/uL (ref 4.20–5.80)
RDW: 12.7 % (ref 11.0–15.0)
Total Lymphocyte: 38.3 %
WBC: 7.1 10*3/uL (ref 3.8–10.8)

## 2021-07-20 LAB — LIPID PANEL
Cholesterol: 175 mg/dL (ref <200)
HDL: 45 mg/dL (ref >=40)
LDL Cholesterol (Calc): 97 mg/dL (calc)
Non-HDL Cholesterol (Calc): 130 mg/dL (calc) — ABNORMAL HIGH (ref <130)
Total CHOL/HDL Ratio: 3.9 (calc) (ref <5.0)
Triglycerides: 210 mg/dL — ABNORMAL HIGH (ref <150)

## 2021-07-29 ENCOUNTER — Encounter: Payer: Self-pay | Admitting: Podiatry

## 2021-09-02 ENCOUNTER — Ambulatory Visit: Payer: BC Managed Care – PPO

## 2021-09-02 DIAGNOSIS — Q828 Other specified congenital malformations of skin: Secondary | ICD-10-CM

## 2021-09-02 NOTE — Progress Notes (Signed)
SITUATION: Reason for Visit: Fitting and Delivery of Custom Fabricated Foot Orthoses Patient Report: Patient reports comfort and is satisfied with device.  OBJECTIVE DATA: Patient History / Diagnosis:     ICD-10-CM   1. Porokeratosis  Q82.8       Provided Device:  Custom Functional Foot Orthotics     RicheyLAB: [id number]  GOAL OF ORTHOSIS - Improve gait - Decrease energy expenditure - Improve Balance - Provide Triplanar stability of foot complex - Facilitate motion  ACTIONS PERFORMED Patient was fit with foot orthotics trimmed to shoe last. Patient tolerated fittign procedure.   Patient was provided with verbal and written instruction and demonstration regarding donning, doffing, wear, care, proper fit, function, purpose, cleaning, and use of the orthosis and in all related precautions and risks and benefits regarding the orthosis.  Patient was also provided with verbal instruction regarding how to report any failures or malfunctions of the orthosis and necessary follow up care. Patient was also instructed to contact our office regarding any change in status that may affect the function of the orthosis.  Patient demonstrated independence with proper donning, doffing, and fit and verbalized understanding of all instructions.  PLAN: Patient is to follow up in one week or as necessary (PRN). All questions were answered and concerns addressed. Plan of care was discussed with and agreed upon by the patient.

## 2021-10-05 ENCOUNTER — Ambulatory Visit: Payer: BC Managed Care – PPO | Admitting: Podiatry

## 2021-10-21 ENCOUNTER — Other Ambulatory Visit: Payer: Self-pay | Admitting: Family Medicine

## 2021-10-21 DIAGNOSIS — I10 Essential (primary) hypertension: Secondary | ICD-10-CM

## 2021-10-21 NOTE — Telephone Encounter (Signed)
Requested Prescriptions  Pending Prescriptions Disp Refills  . losartan-hydrochlorothiazide (HYZAAR) 100-12.5 MG tablet [Pharmacy Med Name: Losartan Potassium-HCTZ 100-12.5 MG Oral Tablet] 90 tablet 0    Sig: Take 1 tablet by mouth once daily     Cardiovascular: ARB + Diuretic Combos Passed - 10/21/2021  5:34 PM      Passed - K in normal range and within 180 days    Potassium  Date Value Ref Range Status  07/19/2021 4.4 3.5 - 5.3 mmol/L Final         Passed - Na in normal range and within 180 days    Sodium  Date Value Ref Range Status  07/19/2021 139 135 - 146 mmol/L Final         Passed - Cr in normal range and within 180 days    Creat  Date Value Ref Range Status  07/19/2021 0.99 0.60 - 1.29 mg/dL Final         Passed - eGFR is 10 or above and within 180 days    GFR, Est African American  Date Value Ref Range Status  06/16/2020 105 > OR = 60 mL/min/1.41m Final   GFR, Est Non African American  Date Value Ref Range Status  06/16/2020 90 > OR = 60 mL/min/1.712mFinal   eGFR  Date Value Ref Range Status  07/19/2021 97 > OR = 60 mL/min/1.7366minal    Comment:    The eGFR is based on the CKD-EPI 2021 equation. To calculate  the new eGFR from a previous Creatinine or Cystatin C result, go to https://www.kidney.org/professionals/ kdoqi/gfr%5Fcalculator          Passed - Patient is not pregnant      Passed - Last BP in normal range    BP Readings from Last 1 Encounters:  07/19/21 128/78         Passed - Valid encounter within last 6 months    Recent Outpatient Visits          3 months ago [facility], [facility]   9 months ago [facility]ential hypertension, [facility]   [facility]   1 year ago [facility] general medical [facility]   1 year ago  Essential hypertension, benign   [facility]      Future Appointments            In 3 months [facility]

## 2021-10-26 ENCOUNTER — Ambulatory Visit: Payer: BC Managed Care – PPO | Admitting: Podiatry

## 2021-11-14 ENCOUNTER — Ambulatory Visit: Payer: BC Managed Care – PPO | Admitting: Podiatry

## 2021-12-12 ENCOUNTER — Encounter: Payer: Self-pay | Admitting: Podiatry

## 2021-12-12 ENCOUNTER — Ambulatory Visit (INDEPENDENT_AMBULATORY_CARE_PROVIDER_SITE_OTHER): Payer: BC Managed Care – PPO | Admitting: Podiatry

## 2021-12-12 DIAGNOSIS — Q666 Other congenital valgus deformities of feet: Secondary | ICD-10-CM

## 2021-12-12 NOTE — Progress Notes (Signed)
He presents today with his wife he states his feet have been bothersome by the end of the day he states he really cannot wear orthotics but in certain work boots.  Objective: Vital signs are stable he is alert and oriented x3 no reproducible pain on palpation though his feet appear to be swelling.  Assessment: Chronic pain forefoot midfoot and arch bilateral.  Plan: Discussed etiology pathology and surgical therapies at this point time we did discuss getting him new boots and if that fails to alleviate his symptoms consider getting new set of orthotics casted.

## 2022-01-15 ENCOUNTER — Other Ambulatory Visit: Payer: Self-pay | Admitting: Family Medicine

## 2022-01-15 DIAGNOSIS — I10 Essential (primary) hypertension: Secondary | ICD-10-CM

## 2022-01-19 ENCOUNTER — Ambulatory Visit: Payer: BC Managed Care – PPO | Admitting: Family Medicine

## 2022-01-19 DIAGNOSIS — E7849 Other hyperlipidemia: Secondary | ICD-10-CM

## 2022-01-19 DIAGNOSIS — Z23 Encounter for immunization: Secondary | ICD-10-CM

## 2022-01-19 DIAGNOSIS — J4520 Mild intermittent asthma, uncomplicated: Secondary | ICD-10-CM

## 2022-01-19 DIAGNOSIS — I10 Essential (primary) hypertension: Secondary | ICD-10-CM

## 2022-01-19 DIAGNOSIS — G4733 Obstructive sleep apnea (adult) (pediatric): Secondary | ICD-10-CM

## 2022-01-24 ENCOUNTER — Ambulatory Visit: Payer: BC Managed Care – PPO | Admitting: Family Medicine

## 2022-02-01 ENCOUNTER — Ambulatory Visit: Payer: BC Managed Care – PPO | Admitting: Family Medicine

## 2022-02-06 ENCOUNTER — Ambulatory Visit: Payer: BC Managed Care – PPO | Admitting: Family Medicine

## 2022-02-07 NOTE — Progress Notes (Unsigned)
There were no vitals taken for this visit.   Subjective:    Patient ID: Jeffrey Cohen, male    DOB: [birth year], 46 y.o.   MRN: [id number]  HPI: Jeffrey Cohen is a 46 y.o. male  No chief complaint on file.  Hypertension: His blood pressure today is ***.  Metoprolol 25 mg daily, losartan-hydrochlorothiazide 100-12.5 mg daily.  He denies any shortness of breath, blurred vision, headaches or chest pain.  We will continue with current treatment plan.  Hyperlipidemia: He currently takes atorvastatin 20 mg daily.  He denies any myalgia.  His last LDL was 97 on 07/19/2021.  We will get labs today. The 10-year ASCVD risk score (Arnett DK, et al., 2019) is: 6.4%   Values used to calculate the score:     Age: 6 years     Sex: Male     Is Non-Hispanic African American: Yes     Diabetic: No     Tobacco smoker: No     Systolic Blood Pressure: 563 mmHg     Is BP treated: Yes     HDL Cholesterol: 45 mg/dL     Total Cholesterol: 175 mg/dL   Obesity: His weight today is ***, with a BMI of ***.  It is recommended that he work on lifestyle modification this would include eating a well-balanced diet with portion control and increasing physical activity as tolerated.  Asthma: Patient reports his asthma is pretty well controlled.  He only has to use his albuterol inhaler every once in a while.  OSA: At last office visit we did discuss that he had been falling asleep prior to putting his CPAP machine on.  Patient was going to work on using it more often.  Patient reports today ***.   Relevant past medical, surgical, family and social history reviewed and updated as indicated. Interim medical history since our last visit reviewed. Allergies and medications reviewed and updated.  Review of Systems Constitutional: Negative for fever or weight change.  Respiratory: Negative for cough and shortness of breath.   Cardiovascular: Negative for chest pain or palpitations.  Gastrointestinal:  Negative for abdominal pain, no bowel changes.  Musculoskeletal: Negative for gait problem or joint swelling.  Positive right knee pain Skin: Negative for rash.  Neurological: Negative for dizziness or headache.  No other specific complaints in a complete review of systems (except as listed in HPI above).      Objective:    There were no vitals taken for this visit.  Wt Readings from Last 3 Encounters:  07/19/21 (!) 318 lb 1.6 oz (144.3 kg)  01/19/21 (!) 315 lb 1.6 oz (142.9 kg)  09/22/20 298 lb (135.2 kg)    Physical Exam  Constitutional: Patient appears well-developed and well-nourished. Obese  No distress.  HEENT: head atraumatic, normocephalic, pupils equal and reactive to light,  neck supple Cardiovascular: Normal rate, regular rhythm and normal heart sounds.  No murmur heard. No BLE edema. Pulmonary/Chest: Effort normal and breath sounds normal. No respiratory distress. Abdominal: Soft.  There is no tenderness. MSK: No decreased range of motion in right knee, slight swelling noted Psychiatric: Patient has a normal mood and affect. behavior is normal. Judgment and thought content normal.  Results for orders placed or performed in visit on 07/19/21  CBC with Differential/Platelet  Result Value Ref Range   WBC 7.1 3.8 - 10.8 Thousand/uL   RBC 5.37 4.20 - 5.80 Million/uL   Hemoglobin 15.1 13.2 - 17.1 g/dL   HCT 45.4  38.5 - 50.0 %   MCV 84.5 80.0 - 100.0 fL   MCH 28.1 27.0 - 33.0 pg   MCHC 33.3 32.0 - 36.0 g/dL   RDW 12.7 11.0 - 15.0 %   Platelets 330 140 - 400 Thousand/uL   MPV 9.8 7.5 - 12.5 fL   Neutro Abs 3,536 1,500 - 7,800 cells/uL   Lymphs Abs 2,719 850 - 3,900 cells/uL   Absolute Monocytes 554 200 - 950 cells/uL   Eosinophils Absolute 270 15 - 500 cells/uL   Basophils Absolute 21 0 - 200 cells/uL   Neutrophils Relative % 49.8 %   Total Lymphocyte 38.3 %   Monocytes Relative 7.8 %   Eosinophils Relative 3.8 %   Basophils Relative 0.3 %  COMPLETE METABOLIC PANEL  WITH GFR  Result Value Ref Range   Glucose, Bld 100 (H) 65 - 99 mg/dL   BUN 11 7 - 25 mg/dL   Creat 0.99 0.60 - 1.29 mg/dL   eGFR 97 > OR = 60 mL/min/1.40m   BUN/Creatinine Ratio NOT APPLICABLE 6 - 22 (calc)   Sodium 139 135 - 146 mmol/L   Potassium 4.4 3.5 - 5.3 mmol/L   Chloride 100 98 - 110 mmol/L   CO2 31 20 - 32 mmol/L   Calcium 9.4 8.6 - 10.3 mg/dL   Total Protein 7.2 6.1 - 8.1 g/dL   Albumin 4.0 3.6 - 5.1 g/dL   Globulin 3.2 1.9 - 3.7 g/dL (calc)   AG Ratio 1.3 1.0 - 2.5 (calc)   Total Bilirubin 0.6 0.2 - 1.2 mg/dL   Alkaline phosphatase (APISO) 104 36 - 130 U/L   AST 15 10 - 40 U/L   ALT 9 9 - 46 U/L  Lipid panel  Result Value Ref Range   Cholesterol 175 <200 mg/dL   HDL 45 > OR = 40 mg/dL   Triglycerides 210 (H) <150 mg/dL   LDL Cholesterol (Calc) 97 mg/dL (calc)   Total CHOL/HDL Ratio 3.9 <5.0 (calc)   Non-HDL Cholesterol (Calc) 130 (H) <130 mg/dL (calc)      Assessment & Plan:   1. Essential hypertension, benign Need to take metoprolol and losartan-hydrochlorothiazide. - CBC with Differential/Platelet - COMPLETE METABOLIC PANEL WITH GFR  2. Other hyperlipidemia Continue to take atorvastatin daily - COMPLETE METABOLIC PANEL WITH GFR - Lipid panel  3. Obesity, Class III, BMI 40-49.9 (morbid obesity) ([place] Work on increasing physical activity, healthy food choices and watching portion size - CBC with Differential/Platelet - COMPLETE METABOLIC PANEL WITH GFR - Lipid panel  4. Mild intermittent asthma without complication Continue to use albuterol inhaler as needed Avoid triggers 5. OSA (obstructive sleep apnea) Work on using CPAP machine more regularly  6. Acute pain of right knee  - DG Knee Complete 4 Views Right; Future - Ambulatory referral to Orthopedic Surgery   Follow up plan: No follow-ups on file.

## 2022-02-08 ENCOUNTER — Encounter: Payer: Self-pay | Admitting: Nurse Practitioner

## 2022-02-08 ENCOUNTER — Ambulatory Visit: Payer: BC Managed Care – PPO | Admitting: Nurse Practitioner

## 2022-02-08 ENCOUNTER — Other Ambulatory Visit: Payer: Self-pay

## 2022-02-08 VITALS — BP 128/76 | HR 90 | Temp 98.2°F | Resp 18 | Ht 73.0 in | Wt 322.2 lb

## 2022-02-08 DIAGNOSIS — I10 Essential (primary) hypertension: Secondary | ICD-10-CM | POA: Diagnosis not present

## 2022-02-08 DIAGNOSIS — Z131 Encounter for screening for diabetes mellitus: Secondary | ICD-10-CM

## 2022-02-08 DIAGNOSIS — G4733 Obstructive sleep apnea (adult) (pediatric): Secondary | ICD-10-CM

## 2022-02-08 DIAGNOSIS — E7849 Other hyperlipidemia: Secondary | ICD-10-CM | POA: Diagnosis not present

## 2022-02-08 DIAGNOSIS — J4520 Mild intermittent asthma, uncomplicated: Secondary | ICD-10-CM | POA: Diagnosis not present

## 2022-02-08 MED ORDER — METOPROLOL SUCCINATE ER 25 MG PO TB24: 25.0000 mg | ORAL_TABLET | Freq: Every day | ORAL | 1 refills | Status: DC

## 2022-02-08 MED ORDER — ATORVASTATIN CALCIUM 20 MG PO TABS: 20.0000 mg | ORAL_TABLET | Freq: Every day | ORAL | 1 refills | Status: DC

## 2022-02-08 MED ORDER — LOSARTAN POTASSIUM-HCTZ 100-12.5 MG PO TABS: 1.0000 | ORAL_TABLET | Freq: Every day | ORAL | 1 refills | Status: DC

## 2022-02-08 NOTE — Assessment & Plan Note (Signed)
Continue using cpap machine 

## 2022-02-08 NOTE — Assessment & Plan Note (Signed)
Blood pressure today is 128/76,  continue taking metoprolol 25 mg daily and losartan-hydrochlorothiazide 100-12.5 mg daily.

## 2022-02-08 NOTE — Assessment & Plan Note (Signed)
Continue working on lifestyle modification 

## 2022-02-08 NOTE — Assessment & Plan Note (Signed)
Doing well no changes 

## 2022-02-08 NOTE — Assessment & Plan Note (Signed)
Continue taking atorvastatin 20 mg daily. 

## 2022-02-09 LAB — CBC WITH DIFFERENTIAL/PLATELET
Absolute Monocytes: 637 cells/uL (ref 200–950)
Basophils Absolute: 28 cells/uL (ref 0–200)
Basophils Relative: 0.4 %
Eosinophils Absolute: 231 cells/uL (ref 15–500)
Eosinophils Relative: 3.3 %
HCT: 44.4 % (ref 38.5–50.0)
Hemoglobin: 14.9 g/dL (ref 13.2–17.1)
Lymphs Abs: 3031 cells/uL (ref 850–3900)
MCH: 28.2 pg (ref 27.0–33.0)
MCHC: 33.6 g/dL (ref 32.0–36.0)
MCV: 84.1 fL (ref 80.0–100.0)
MPV: 10.3 fL (ref 7.5–12.5)
Monocytes Relative: 9.1 %
Neutro Abs: 3073 cells/uL (ref 1500–7800)
Neutrophils Relative %: 43.9 %
Platelets: 312 10*3/uL (ref 140–400)
RBC: 5.28 10*6/uL (ref 4.20–5.80)
RDW: 13 % (ref 11.0–15.0)
Total Lymphocyte: 43.3 %
WBC: 7 10*3/uL (ref 3.8–10.8)

## 2022-02-09 LAB — COMPLETE METABOLIC PANEL WITH GFR
AG Ratio: 1.3 (calc) (ref 1.0–2.5)
ALT: 10 U/L (ref 9–46)
AST: 16 U/L (ref 10–40)
Albumin: 4.3 g/dL (ref 3.6–5.1)
Alkaline phosphatase (APISO): 94 U/L (ref 36–130)
BUN: 16 mg/dL (ref 7–25)
CO2: 33 mmol/L — ABNORMAL HIGH (ref 20–32)
Calcium: 9.8 mg/dL (ref 8.6–10.3)
Chloride: 100 mmol/L (ref 98–110)
Creat: 0.98 mg/dL (ref 0.60–1.29)
Globulin: 3.3 g/dL (calc) (ref 1.9–3.7)
Glucose, Bld: 80 mg/dL (ref 65–99)
Potassium: 4.5 mmol/L (ref 3.5–5.3)
Sodium: 139 mmol/L (ref 135–146)
Total Bilirubin: 0.4 mg/dL (ref 0.2–1.2)
Total Protein: 7.6 g/dL (ref 6.1–8.1)
eGFR: 98 mL/min/{1.73_m2} (ref >=60)

## 2022-02-09 LAB — LIPID PANEL
Cholesterol: 235 mg/dL — ABNORMAL HIGH (ref <200)
HDL: 52 mg/dL (ref >=40)
LDL Cholesterol (Calc): 154 mg/dL (calc) — ABNORMAL HIGH
Non-HDL Cholesterol (Calc): 183 mg/dL (calc) — ABNORMAL HIGH (ref <130)
Total CHOL/HDL Ratio: 4.5 (calc) (ref <5.0)
Triglycerides: 156 mg/dL — ABNORMAL HIGH (ref <150)

## 2022-02-09 LAB — HEMOGLOBIN A1C
Hgb A1c MFr Bld: 5.9 % of total Hgb — ABNORMAL HIGH (ref <5.7)
Mean Plasma Glucose: 123 mg/dL
eAG (mmol/L): 6.8 mmol/L

## 2022-03-15 ENCOUNTER — Ambulatory Visit: Payer: BC Managed Care – PPO | Admitting: Podiatry

## 2022-04-26 ENCOUNTER — Ambulatory Visit: Payer: BC Managed Care – PPO | Admitting: Podiatry

## 2022-05-24 ENCOUNTER — Ambulatory Visit: Payer: BC Managed Care – PPO | Admitting: Podiatry

## 2022-05-24 DIAGNOSIS — Q666 Other congenital valgus deformities of feet: Secondary | ICD-10-CM | POA: Diagnosis not present

## 2022-05-25 NOTE — Progress Notes (Signed)
He presents today for follow-up of his pes planovalgus.  States that he has been wearing his orthotics that he states that they are doing great he is not having any foot pain at this point he states that his feet are tired by the end of the day when he does a lot of walking but other than that he is doing quite well.  Continues to lose weight on a regular basis.  Objective: Vital signs are stable he is alert and oriented x 3 pulses are palpable.  Neurologic sensorium is intact.  Deep tendon reflexes are normal muscle strength is normal and symmetrical.  His nails are well-kept skin appears to be in tact with no open lesions or wounds.  He does have some dry skin around the heels.  Assessment: Pes planovalgus bilateral.  Plan: Encouraged him to continue to wear good boots and his orthotics on a regular basis we will follow-up with him on an as-needed basis.

## 2022-07-26 ENCOUNTER — Ambulatory Visit (INDEPENDENT_AMBULATORY_CARE_PROVIDER_SITE_OTHER): Payer: BC Managed Care – PPO

## 2022-07-26 ENCOUNTER — Ambulatory Visit: Payer: BC Managed Care – PPO | Admitting: Podiatry

## 2022-07-26 DIAGNOSIS — Q666 Other congenital valgus deformities of feet: Secondary | ICD-10-CM

## 2022-07-26 DIAGNOSIS — D2370 Other benign neoplasm of skin of unspecified lower limb, including hip: Secondary | ICD-10-CM

## 2022-07-26 NOTE — Progress Notes (Signed)
He presents today chief complaint of painful lesions lateral aspect of the fifth metatarsal of the right foot.  He is also concerned about his pes planovalgus.  Objective: Vital signs are stable he is alert oriented x 3 pulses are palpable.  Neurologic sensorium is intact.  Lateral aspect of the fifth metatarsal head of the right foot does demonstrate punctated porokeratotic type lesions which are painful on palpation as well as debridement.  He also has flexible pes planovalgus bilateral radiographically this is characterized by a plantarflexed talus and talonavicular joint.  He does have a mildly elevated first metatarsal.  There appears to be no osteoarthritic changes of the foot bilaterally.  Assessment: Benign skin lesions bilateral and pes planovalgus bilateral.  Plan: At this point I feel that we need to get him scheduled to have orthotics casted.  I also debrided his benign skin lesions.

## 2022-08-07 ENCOUNTER — Other Ambulatory Visit: Payer: Self-pay | Admitting: Nurse Practitioner

## 2022-08-07 DIAGNOSIS — E7849 Other hyperlipidemia: Secondary | ICD-10-CM

## 2022-08-07 DIAGNOSIS — I10 Essential (primary) hypertension: Secondary | ICD-10-CM

## 2022-08-08 NOTE — Telephone Encounter (Signed)
Called pt schedule appt - left message on machine to call back and schedule.

## 2022-08-08 NOTE — Telephone Encounter (Signed)
Courtesy refill. Patient will need an office visit for further refills. Requested Prescriptions  Pending Prescriptions Disp Refills   metoprolol succinate (TOPROL-XL) 25 MG 24 hr tablet [Pharmacy Med Name: Metoprolol Succinate ER 25 MG Oral Tablet Extended Release 24 Hour] 30 tablet 0    Sig: Take 1 tablet by mouth once daily     Cardiovascular:  Beta Blockers Passed - 08/07/2022  6:52 AM      Passed - Last BP in normal range    BP Readings from Last 1 Encounters:  02/08/22 128/76         Passed - Last Heart Rate in normal range    Pulse Readings from Last 1 Encounters:  02/08/22 90         Passed - Valid encounter within last 6 months    Recent Outpatient Visits           6 months ago Essential hypertension, benign   [facility] Berniece Salines, FNP   1 year ago Essential hypertension, benign   [facility] Berniece Salines, FNP   1 year ago Essential hypertension, benign   [facility] Caro Laroche, DO   1 year ago COVID-19    [facility] Berniece Salines, FNP   2 years ago Adult general medical exam   [facility] Danelle Berry, PA-C               atorvastatin (LIPITOR) 20 MG tablet [Pharmacy Med Name: Atorvastatin Calcium 20 MG Oral Tablet] 90 tablet 0    Sig: Take 1 tablet by mouth once daily     Cardiovascular:  Antilipid - Statins Failed - 08/07/2022  6:52 AM      Failed - Lipid Panel in normal range within the last 12 months    Cholesterol  Date Value Ref Range Status  02/08/2022 235 (H) <200 mg/dL Final   LDL Cholesterol (Calc)  Date Value Ref Range Status  02/08/2022 154 (H) mg/dL (calc) Final    Comment:    Reference range: <100 . Desirable range <100 mg/dL for primary prevention;   <70 mg/dL for patients with CHD or diabetic patients  with > or = 2 CHD risk factors. Marland Kitchen LDL-C is now calculated using the Martin-Hopkins   calculation, which is a validated novel method providing  better accuracy than the Friedewald equation in the  estimation of LDL-C.  Horald Pollen et al. Lenox Ahr. 1610;960(45): 2061-2068  (http://education.[employer].com/faq/FAQ164)    HDL  Date Value Ref Range Status  02/08/2022 52 > OR = 40 mg/dL Final   Triglycerides  Date Value Ref Range Status  02/08/2022 156 (H) <150 mg/dL Final         Passed - Patient is not pregnant      Passed - Valid encounter within last 12 months    Recent Outpatient Visits           6 months ago Essential hypertension, benign   [facility] Berniece Salines, FNP   1 year ago Essential hypertension, benign   [facility] Berniece Salines, FNP   1 year ago Essential hypertension, benign   [facility] Caro Laroche, DO   1 year ago COVID-19   [facility] Berniece Salines, FNP   2 years ago Adult general medical exam   [facility] Angelica Chessman,  Sheliah Mends, PA-C

## 2022-08-09 ENCOUNTER — Ambulatory Visit: Payer: BC Managed Care – PPO | Admitting: Family Medicine

## 2022-08-30 ENCOUNTER — Ambulatory Visit: Payer: BC Managed Care – PPO | Admitting: Podiatry

## 2022-08-31 NOTE — Progress Notes (Deleted)
There were no vitals taken for this visit.   Subjective:    Patient ID: Jeffrey Cohen, male    DOB: Sep 20, 1975, 47 y.o.   MRN: [id number]  HPI: Jeffrey Cohen is a 47 y.o. male  No chief complaint on file.  Hypertension:  -Medications: Metoprolol 25 mg daily, losartan-hydrochlorothiazide 100-12.5 mg daily.  -Patient is compliant with above medications and reports no side effects. -Checking BP at home (average): *** -Highest BP at home: *** -Lowest BP at home: *** -Denies any SOB, CP, vision changes, LE edema or symptoms of hypotension -Diet: recommend DASH diet -Exercise: ***   HLD:  -Medications: atorvastatin 20 mg daily -Patient is compliant with above medications and reports no side effects.  -Last lipid panel:   Lipid Panel     Component Value Date/Time   CHOL 235 (H) 02/08/2022 1438   TRIG 156 (H) 02/08/2022 1438   HDL 52 02/08/2022 1438   CHOLHDL 4.5 02/08/2022 1438   VLDL 19 10/18/2016 1135   LDLCALC 154 (H) 02/08/2022 1438    The 10-year ASCVD risk score (Arnett DK, et al., 2019) is: 6.7%   Values used to calculate the score:     Age: 52 years     Sex: Male     Is Non-Hispanic African American: Yes     Diabetic: No     Tobacco smoker: No     Systolic Blood Pressure: 128 mmHg     Is BP treated: Yes     HDL Cholesterol: 52 mg/dL     Total Cholesterol: 235 mg/dL   Obesity: His weight today is 322 lbs, with a BMI of 42.51.  It is recommended that he work on lifestyle modification this would include eating a well-balanced diet with portion control and increasing physical activity as tolerated. Obesity:  Current weight : *** BMI: *** Highest weight:*** Treatment Tried: *** Comorbidities: asthma, HLD, HTN   Asthma: Patient reports his asthma is pretty well controlled.  He only has to use his albuterol inhaler every once in a while. Asthma:    -Asthma status: controlled -Current Treatments: albuterol inhaler -Satisfied with current  treatment?: yes -Albuterol/rescue inhaler frequency: *** -Dyspnea frequency:  -Wheezing frequency: -Cough frequency:  -Nocturnal symptom frequency:  -Limitation of activity: no -Current upper respiratory symptoms: no -Triggers:  -Home peak flows: -Last Spirometry:  -Failed/intolerant to following asthma meds:  -Asthma meds in past:  -Aerochamber/spacer use: no -Visits to ER or Urgent Care in past year: no -Pneumovax: Not up to Date -Influenza: Not up to Date   OSA: At last office visit we did discuss that he had been falling asleep prior to putting his CPAP machine on.  Patient was going to work on using it more often.  Patient reports today he is using most nights.  He wakes up feeling rested.   Relevant past medical, surgical, family and social history reviewed and updated as indicated. Interim medical history since our last visit reviewed. Allergies and medications reviewed and updated.  Review of Systems Constitutional: Negative for fever or weight change.  Respiratory: Negative for cough and shortness of breath.   Cardiovascular: Negative for chest pain or palpitations.  Gastrointestinal: Negative for abdominal pain, no bowel changes.  Musculoskeletal: Negative for gait problem or joint swelling.   Skin: Negative for rash.  Neurological: Negative for dizziness or headache.  No other specific complaints in a complete review of systems (except as listed in HPI above).      Objective:  There were no vitals taken for this visit.  Wt Readings from Last 3 Encounters:  02/08/22 (!) 322 lb 3.2 oz (146.1 kg)  07/19/21 (!) 318 lb 1.6 oz (144.3 kg)  01/19/21 (!) 315 lb 1.6 oz (142.9 kg)    Physical Exam  Constitutional: Patient appears well-developed and well-nourished. Obese  No distress.  HEENT: head atraumatic, normocephalic, pupils equal and reactive to light,  neck supple Cardiovascular: Normal rate, regular rhythm and normal heart sounds.  No murmur heard. No BLE  edema. Pulmonary/Chest: Effort normal and breath sounds normal. No respiratory distress. Abdominal: Soft.  There is no tenderness. Psychiatric: Patient has a normal mood and affect. behavior is normal. Judgment and thought content normal.  Results for orders placed or performed in visit on 02/08/22  COMPLETE METABOLIC PANEL WITH GFR  Result Value Ref Range   Glucose, Bld 80 65 - 99 mg/dL   BUN 16 7 - 25 mg/dL   Creat 1.19 1.47 - 8.29 mg/dL   eGFR 98 > OR = 60 FA/OZH/0.86V7   BUN/Creatinine Ratio SEE NOTE: 6 - 22 (calc)   Sodium 139 135 - 146 mmol/L   Potassium 4.5 3.5 - 5.3 mmol/L   Chloride 100 98 - 110 mmol/L   CO2 33 (H) 20 - 32 mmol/L   Calcium 9.8 8.6 - 10.3 mg/dL   Total Protein 7.6 6.1 - 8.1 g/dL   Albumin 4.3 3.6 - 5.1 g/dL   Globulin 3.3 1.9 - 3.7 g/dL (calc)   AG Ratio 1.3 1.0 - 2.5 (calc)   Total Bilirubin 0.4 0.2 - 1.2 mg/dL   Alkaline phosphatase (APISO) 94 36 - 130 U/L   AST 16 10 - 40 U/L   ALT 10 9 - 46 U/L  CBC with Differential/Platelet  Result Value Ref Range   WBC 7.0 3.8 - 10.8 Thousand/uL   RBC 5.28 4.20 - 5.80 Million/uL   Hemoglobin 14.9 13.2 - 17.1 g/dL   HCT 84.6 96.2 - 95.2 %   MCV 84.1 80.0 - 100.0 fL   MCH 28.2 27.0 - 33.0 pg   MCHC 33.6 32.0 - 36.0 g/dL   RDW 84.1 32.4 - 40.1 %   Platelets 312 140 - 400 Thousand/uL   MPV 10.3 7.5 - 12.5 fL   Neutro Abs 3,073 1,500 - 7,800 cells/uL   Lymphs Abs 3,031 850 - 3,900 cells/uL   Absolute Monocytes 637 200 - 950 cells/uL   Eosinophils Absolute 231 15 - 500 cells/uL   Basophils Absolute 28 0 - 200 cells/uL   Neutrophils Relative % 43.9 %   Total Lymphocyte 43.3 %   Monocytes Relative 9.1 %   Eosinophils Relative 3.3 %   Basophils Relative 0.4 %  Lipid panel  Result Value Ref Range   Cholesterol 235 (H) <200 mg/dL   HDL 52 > OR = 40 mg/dL   Triglycerides 027 (H) <150 mg/dL   LDL Cholesterol (Calc) 154 (H) mg/dL (calc)   Total CHOL/HDL Ratio 4.5 <5.0 (calc)   Non-HDL Cholesterol (Calc) 183 (H)  <130 mg/dL (calc)  Hemoglobin O5D  Result Value Ref Range   Hgb A1c MFr Bld 5.9 (H) <5.7 % of total Hgb   Mean Plasma Glucose 123 mg/dL   eAG (mmol/L) 6.8 mmol/L      Assessment & Plan:   Problem List Items Addressed This Visit   None    Follow up plan: No follow-ups on file.

## 2022-09-01 ENCOUNTER — Ambulatory Visit: Payer: BC Managed Care – PPO | Admitting: Nurse Practitioner

## 2022-09-05 ENCOUNTER — Other Ambulatory Visit: Payer: Self-pay | Admitting: Family Medicine

## 2022-09-05 DIAGNOSIS — I10 Essential (primary) hypertension: Secondary | ICD-10-CM

## 2022-09-13 ENCOUNTER — Ambulatory Visit: Payer: BC Managed Care – PPO | Admitting: Podiatry

## 2022-10-11 ENCOUNTER — Ambulatory Visit: Payer: BC Managed Care – PPO | Admitting: Podiatry

## 2022-10-16 ENCOUNTER — Other Ambulatory Visit: Payer: Self-pay

## 2022-10-16 ENCOUNTER — Ambulatory Visit: Payer: BC Managed Care – PPO | Admitting: Nurse Practitioner

## 2022-10-16 ENCOUNTER — Encounter: Payer: Self-pay | Admitting: Nurse Practitioner

## 2022-10-16 VITALS — BP 130/76 | HR 82 | Temp 97.8°F | Resp 18 | Ht 73.0 in | Wt 330.4 lb

## 2022-10-16 DIAGNOSIS — J4520 Mild intermittent asthma, uncomplicated: Secondary | ICD-10-CM

## 2022-10-16 DIAGNOSIS — E7849 Other hyperlipidemia: Secondary | ICD-10-CM

## 2022-10-16 DIAGNOSIS — I10 Essential (primary) hypertension: Secondary | ICD-10-CM | POA: Diagnosis not present

## 2022-10-16 DIAGNOSIS — Z131 Encounter for screening for diabetes mellitus: Secondary | ICD-10-CM

## 2022-10-16 DIAGNOSIS — G4733 Obstructive sleep apnea (adult) (pediatric): Secondary | ICD-10-CM

## 2022-10-16 LAB — CBC WITH DIFFERENTIAL/PLATELET
Absolute Monocytes: 351 cells/uL (ref 200–950)
Basophils Absolute: 32 cells/uL (ref 0–200)
Basophils Relative: 0.7 %
Eosinophils Absolute: 248 cells/uL (ref 15–500)
Eosinophils Relative: 5.5 %
HCT: 42.6 % (ref 38.5–50.0)
Hemoglobin: 13.7 g/dL (ref 13.2–17.1)
Lymphs Abs: 1557 cells/uL (ref 850–3900)
MCH: 27.7 pg (ref 27.0–33.0)
MCHC: 32.2 g/dL (ref 32.0–36.0)
MCV: 86.1 fL (ref 80.0–100.0)
MPV: 10 fL (ref 7.5–12.5)
Monocytes Relative: 7.8 %
Neutro Abs: 2313 cells/uL (ref 1500–7800)
Neutrophils Relative %: 51.4 %
Platelets: 274 10*3/uL (ref 140–400)
RBC: 4.95 10*6/uL (ref 4.20–5.80)
RDW: 13.4 % (ref 11.0–15.0)
Total Lymphocyte: 34.6 %
WBC: 4.5 10*3/uL (ref 3.8–10.8)

## 2022-10-16 MED ORDER — METOPROLOL SUCCINATE ER 25 MG PO TB24: 25.0000 mg | ORAL_TABLET | Freq: Every day | ORAL | 1 refills | Status: DC

## 2022-10-16 MED ORDER — LOSARTAN POTASSIUM-HCTZ 100-12.5 MG PO TABS: 1.0000 | ORAL_TABLET | Freq: Every day | ORAL | 1 refills | Status: DC

## 2022-10-16 NOTE — Assessment & Plan Note (Signed)
Increase physical activity and eat well balanced diet with portion control

## 2022-10-16 NOTE — Assessment & Plan Note (Signed)
Continue taking metoprolol 25 mg daily, losartan-hydrochlorothiazide 100-12.5 mg daily.  Recommend dash diet and using your CPAP machine

## 2022-10-16 NOTE — Assessment & Plan Note (Signed)
stable °

## 2022-10-16 NOTE — Assessment & Plan Note (Signed)
Start using your CPAP machine.

## 2022-10-16 NOTE — Assessment & Plan Note (Signed)
Continue atorvastatin 20 mg daily. 

## 2022-10-16 NOTE — Progress Notes (Signed)
BP 130/76   Pulse 82   Temp 97.8 F (36.6 C) (Oral)   Resp 18   Ht 6\' 1"  (1.854 m)   Wt (!) 330 lb 6.4 oz (149.9 kg)   SpO2 98%   BMI 43.59 kg/m    Subjective:    Patient ID: Jeffrey Cohen, male    DOB: Jan 05, 1976, 47 y.o.   MRN: [id number]  HPI: Jeffrey Cohen is a 47 y.o. male  Chief Complaint  Patient presents with   Hypertension   Hyperlipidemia   Obesity    6 month follow up   Hypertension:  -Medications: Metoprolol 25 mg daily, losartan-hydrochlorothiazide 100-12.5 mg daily.  -Patient is compliant with above medications and reports no side effects. -Checking BP at home (average): no -Denies any SOB, CP, vision changes, LE edema or symptoms of hypotension -Diet: recommend DASH diet  -Exercise: recommend 150 min of physical activity weekly       10/16/2022    8:05 AM 02/08/2022    2:23 PM 07/19/2021    3:15 PM  Vitals with BMI  Height 6\' 1"  6\' 1"  6\' 1"   Weight 330 lbs 6 oz 322 lbs 3 oz 318 lbs 2 oz  BMI 43.6 42.52 41.98  Systolic 130 128 536  Diastolic 76 76 78  Pulse 82 90 99    HLD:  -Medications: atorvastatin 20 mg daily -Patient is compliant with above medications and reports no side effects.  -Last lipid panel:   Lipid Panel     Component Value Date/Time   CHOL 235 (H) 02/08/2022 1438   TRIG 156 (H) 02/08/2022 1438   HDL 52 02/08/2022 1438   CHOLHDL 4.5 02/08/2022 1438   VLDL 19 10/18/2016 1135   LDLCALC 154 (H) 02/08/2022 1438    The 10-year ASCVD risk score (Arnett DK, et al., 2019) is: 7.3%   Values used to calculate the score:     Age: 66 years     Sex: Male     Is Non-Hispanic African American: Yes     Diabetic: No     Tobacco smoker: No     Systolic Blood Pressure: 130 mmHg     Is BP treated: Yes     HDL Cholesterol: 52 mg/dL     Total Cholesterol: 235 mg/dL   Obesity:  Current weight : 330 lbs BMI: 43.59 previous weight:322 lbs Treatment Tried: lifestyle modification Comorbidities: HTN, HLD   Asthma:     -Asthma status: stable -Current Treatments: albuterol inhaler as needed -Satisfied with current treatment?: yes -Albuterol/rescue inhaler frequency: rarely -Limitation of activity: no -Current upper respiratory symptoms: no -Triggers: running -Failed/intolerant to following asthma meds:  -Aerochamber/spacer use: no -Visits to ER or Urgent Care in past year: no -Pneumovax: Not up to Date -Influenza: Up to Date   OSA: he reports he has not been using his cpap machine. Discussed risks.    Relevant past medical, surgical, family and social history reviewed and updated as indicated. Interim medical history since our last visit reviewed. Allergies and medications reviewed and updated.  Review of Systems Constitutional: Negative for fever or weight change.  Respiratory: Negative for cough and shortness of breath.   Cardiovascular: Negative for chest pain or palpitations.  Gastrointestinal: Negative for abdominal pain, no bowel changes.  Musculoskeletal: Negative for gait problem or joint swelling.   Skin: Negative for rash.  Neurological: Negative for dizziness or headache.  No other specific complaints in a complete review of systems (except as listed in HPI  above).      Objective:    BP 130/76   Pulse 82   Temp 97.8 F (36.6 C) (Oral)   Resp 18   Ht 6\' 1"  (1.854 m)   Wt (!) 330 lb 6.4 oz (149.9 kg)   SpO2 98%   BMI 43.59 kg/m   Wt Readings from Last 3 Encounters:  10/16/22 (!) 330 lb 6.4 oz (149.9 kg)  02/08/22 (!) 322 lb 3.2 oz (146.1 kg)  07/19/21 (!) 318 lb 1.6 oz (144.3 kg)    Physical Exam  Constitutional: Patient appears well-developed and well-nourished. Obese  No distress.  HEENT: head atraumatic, normocephalic, pupils equal and reactive to light,  neck supple Cardiovascular: Normal rate, regular rhythm and normal heart sounds.  No murmur heard. No BLE edema. Pulmonary/Chest: Effort normal and breath sounds normal. No respiratory distress. Abdominal: Soft.   There is no tenderness. Psychiatric: Patient has a normal mood and affect. behavior is normal. Judgment and thought content normal.  Results for orders placed or performed in visit on 02/08/22  COMPLETE METABOLIC PANEL WITH GFR  Result Value Ref Range   Glucose, Bld 80 65 - 99 mg/dL   BUN 16 7 - 25 mg/dL   Creat 3.66 4.40 - 3.47 mg/dL   eGFR 98 > OR = 60 QQ/VZD/6.38V5   BUN/Creatinine Ratio SEE NOTE: 6 - 22 (calc)   Sodium 139 135 - 146 mmol/L   Potassium 4.5 3.5 - 5.3 mmol/L   Chloride 100 98 - 110 mmol/L   CO2 33 (H) 20 - 32 mmol/L   Calcium 9.8 8.6 - 10.3 mg/dL   Total Protein 7.6 6.1 - 8.1 g/dL   Albumin 4.3 3.6 - 5.1 g/dL   Globulin 3.3 1.9 - 3.7 g/dL (calc)   AG Ratio 1.3 1.0 - 2.5 (calc)   Total Bilirubin 0.4 0.2 - 1.2 mg/dL   Alkaline phosphatase (APISO) 94 36 - 130 U/L   AST 16 10 - 40 U/L   ALT 10 9 - 46 U/L  CBC with Differential/Platelet  Result Value Ref Range   WBC 7.0 3.8 - 10.8 Thousand/uL   RBC 5.28 4.20 - 5.80 Million/uL   Hemoglobin 14.9 13.2 - 17.1 g/dL   HCT 64.3 32.9 - 51.8 %   MCV 84.1 80.0 - 100.0 fL   MCH 28.2 27.0 - 33.0 pg   MCHC 33.6 32.0 - 36.0 g/dL   RDW 84.1 66.0 - 63.0 %   Platelets 312 140 - 400 Thousand/uL   MPV 10.3 7.5 - 12.5 fL   Neutro Abs 3,073 1,500 - 7,800 cells/uL   Lymphs Abs 3,031 850 - 3,900 cells/uL   Absolute Monocytes 637 200 - 950 cells/uL   Eosinophils Absolute 231 15 - 500 cells/uL   Basophils Absolute 28 0 - 200 cells/uL   Neutrophils Relative % 43.9 %   Total Lymphocyte 43.3 %   Monocytes Relative 9.1 %   Eosinophils Relative 3.3 %   Basophils Relative 0.4 %  Lipid panel  Result Value Ref Range   Cholesterol 235 (H) <200 mg/dL   HDL 52 > OR = 40 mg/dL   Triglycerides 160 (H) <150 mg/dL   LDL Cholesterol (Calc) 154 (H) mg/dL (calc)   Total CHOL/HDL Ratio 4.5 <5.0 (calc)   Non-HDL Cholesterol (Calc) 183 (H) <130 mg/dL (calc)  Hemoglobin F0X  Result Value Ref Range   Hgb A1c MFr Bld 5.9 (H) <5.7 % of total Hgb    Mean Plasma Glucose 123 mg/dL  eAG (mmol/L) 6.8 mmol/L      Assessment & Plan:   Problem List Items Addressed This Visit       Cardiovascular and Mediastinum   Essential hypertension, benign - Primary (Chronic)    Continue taking metoprolol 25 mg daily, losartan-hydrochlorothiazide 100-12.5 mg daily.  Recommend dash diet and using your CPAP machine      Relevant Medications   metoprolol succinate (TOPROL-XL) 25 MG 24 hr tablet   losartan-hydrochlorothiazide (HYZAAR) 100-12.5 MG tablet   Other Relevant Orders   CBC with Differential/Platelet   COMPLETE METABOLIC PANEL WITH GFR     Respiratory   Mild intermittent asthma without complication    stable      OSA (obstructive sleep apnea)    Start using your CPAP machine        Other   Hyperlipidemia    Continue atorvastatin 20 mg daily      Relevant Medications   metoprolol succinate (TOPROL-XL) 25 MG 24 hr tablet   losartan-hydrochlorothiazide (HYZAAR) 100-12.5 MG tablet   Other Relevant Orders   COMPLETE METABOLIC PANEL WITH GFR   Lipid panel   Obesity, Class III, BMI 40-49.9 (morbid obesity) (HCC)    Increase physical activity and eat well balanced diet with portion control       Other Visit Diagnoses     Screening for diabetes mellitus       Relevant Orders   Hemoglobin A1c         Follow up plan: Return in about 6 months (around 04/18/2023) for follow up.

## 2022-11-01 ENCOUNTER — Ambulatory Visit: Payer: BC Managed Care – PPO | Admitting: Podiatry

## 2022-12-11 ENCOUNTER — Encounter: Payer: Self-pay | Admitting: Podiatry

## 2022-12-11 ENCOUNTER — Ambulatory Visit (INDEPENDENT_AMBULATORY_CARE_PROVIDER_SITE_OTHER): Payer: BC Managed Care – PPO | Admitting: Podiatry

## 2022-12-11 DIAGNOSIS — D2370 Other benign neoplasm of skin of unspecified lower limb, including hip: Secondary | ICD-10-CM

## 2022-12-11 NOTE — Progress Notes (Signed)
He presents today chief complaint of painful porokeratotic lesions and flatfoot deformity bilateral.  Denies any changes in his past medical history medications or allergies.  States that he still has not gotten his orthotics yet.  Objective: Vital signs are stable alert oriented x 3.  Pulses are palpable.  There is no erythema edema salines drainage odor pes planovalgus is noted bilateral with benign skin lesions no ulcerative lesions.  Pulses are strongly palpable.  Assessment: Pes planovalgus benign skin lesions.  Plan: Debrided benign skin lesions he will meet Bethann Berkshire for a rescan on his orthotics this Friday

## 2022-12-19 ENCOUNTER — Ambulatory Visit (INDEPENDENT_AMBULATORY_CARE_PROVIDER_SITE_OTHER): Payer: BC Managed Care – PPO

## 2022-12-19 ENCOUNTER — Ambulatory Visit
Admission: RE | Admit: 2022-12-19 | Discharge: 2022-12-19 | Disposition: A | Discharge status: Home / Self Care | Payer: BC Managed Care – PPO | Source: Ambulatory Visit | Attending: Emergency Medicine | Admitting: Emergency Medicine

## 2022-12-19 VITALS — BP 143/92 | HR 73 | Temp 98.2°F | Resp 18 | Ht 73.0 in | Wt 315.0 lb

## 2022-12-19 DIAGNOSIS — J4521 Mild intermittent asthma with (acute) exacerbation: Secondary | ICD-10-CM

## 2022-12-19 DIAGNOSIS — J069 Acute upper respiratory infection, unspecified: Secondary | ICD-10-CM

## 2022-12-19 MED ORDER — PREDNISONE 10 MG PO TABS: 20.0000 mg | ORAL_TABLET | Freq: Every day | ORAL | 0 refills | Status: DC

## 2022-12-19 MED ORDER — PSEUDOEPH-BROMPHEN-DM 30-2-10 MG/5ML PO SYRP: 5.0000 mL | ORAL_SOLUTION | Freq: Three times a day (TID) | ORAL | 0 refills | Status: DC | PRN

## 2022-12-19 NOTE — ED Provider Notes (Signed)
[facility]    CSN: [id number] Arrival date & time: 12/19/22  1632      History   Chief Complaint Chief Complaint  Patient presents with   Wheezing    Appt   Cough   Shortness of Breath    HPI Jeffrey Cohen is a 47 y.o. male.   47 year old male patient, Jeffrey Cohen, presents to urgent care for evaluation of cough, wheezing, and short of breath x 1 week.  Patient states he has frequent episodes of coughing up sputum, labored breathing after minor activity.  Patient has a history of asthma and uses his inhaler as prescribed.  Pt denies chest pain or palpitations  The history is provided by the patient. No language interpreter was used.    Past Medical History:  Diagnosis Date   Asthma    Essential hypertension, benign 03/02/2015   Obesity 03/02/2015    Patient Active Problem List   Diagnosis Date Noted   Mild intermittent asthma with exacerbation 12/19/2022   Viral URI with cough 12/19/2022   Mild intermittent asthma without complication 06/16/2020   Bleeding nose 05/05/2019   Headache disorder 05/05/2019   Obesity, Class III, BMI 40-49.9 (morbid obesity) (HCC) 05/05/2019   OSA (obstructive sleep apnea) 05/05/2019   Photophobia 05/05/2019   Chronic venous insufficiency 01/31/2017   Hyperlipidemia 10/18/2016   Acanthosis nigricans 04/14/2016   Eczema 04/14/2016   Acrochordon 04/14/2016   Pain in limb 12/24/2015   Essential hypertension, benign 03/02/2015   Class 3 severe obesity with body mass index (BMI) of 40.0 to 44.9 in adult (HCC) 03/02/2015   Varicose veins of both lower extremities with complications 02/24/2015    Past Surgical History:  Procedure Laterality Date   TENDON REPAIR     left thumb   VEIN LIGATION AND STRIPPING Bilateral 10/29/2015   [id number]       Home Medications    Prior to Admission medications   Medication Sig Start Date End Date Taking? Authorizing Provider  albuterol (VENTOLIN HFA) 108 (90 Base) MCG/ACT  inhaler INHALE 2 PUFFS BY MOUTH EVERY 6 HOURS AS NEEDED FOR WHEEZING FOR SHORTNESS OF BREATH 06/07/20  Yes Danelle Berry, PA-C  atorvastatin (LIPITOR) 20 MG tablet Take 1 tablet by mouth once daily 08/08/22  Yes Tapia, Sheliah Mends, PA-C  brompheniramine-pseudoephedrine-DM 30-2-10 MG/5ML syrup Take 5 mLs by mouth 3 (three) times daily as needed. 12/19/22  Yes Bryen Hinderman, Para March, NP  losartan-hydrochlorothiazide (HYZAAR) 100-12.5 MG tablet Take 1 tablet by mouth daily. 10/16/22  Yes Berniece Salines, FNP  metoprolol succinate (TOPROL-XL) 25 MG 24 hr tablet Take 1 tablet (25 mg total) by mouth daily. 10/16/22  Yes Berniece Salines, FNP  nortriptyline (PAMELOR) 10 MG capsule Take by mouth. 10/06/19  Yes [provider]  predniSONE (DELTASONE) 10 MG tablet Take 2 tablets (20 mg total) by mouth daily with breakfast. X 5 days, Disp # 10, no refills 12/19/22  Yes Anniya Whiters, NP  Ubrogepant 50 MG TABS Take 50 mg by mouth. Take 50-100mg  at headache onset. Can repeat after 2 hours if needed. Do not exceed 200mg  in 24 hours.   Yes [provider]    Family History Family History  Problem Relation Age of Onset   Cancer Mother        liver   Emphysema Mother    Hypertension Mother    Crohn's disease Mother    Hernia Mother    Diabetes Maternal Grandfather    Stroke Maternal Grandmother  Asthma Daughter    Heart disease Neg Hx    COPD Neg Hx     Social History Social History   Tobacco Use   Smoking status: Never   Smokeless tobacco: Never  Vaping Use   Vaping status: Never Used  Substance Use Topics   Alcohol use: Yes    Comment: occasional   Drug use: No     Allergies   Penicillins   Review of Systems Review of Systems  Constitutional:  Negative for fever.  Respiratory:  Positive for cough, shortness of breath and wheezing.   Cardiovascular:  Negative for chest pain and palpitations.  All other systems reviewed and are negative.    Physical Exam Triage Vital  Signs ED Triage Vitals  Encounter Vitals Group     BP 12/19/22 1636 (!) 143/92     Systolic BP Percentile --      Diastolic BP Percentile --      Pulse Rate 12/19/22 1636 73     Resp 12/19/22 1636 18     Temp 12/19/22 1636 98.2 F (36.8 C)     Temp Source 12/19/22 1636 Oral     SpO2 12/19/22 1636 94 %     Weight 12/19/22 1636 (!) 315 lb (142.9 kg)     Height 12/19/22 1636 6\' 1"  (1.854 m)     Head Circumference --      Peak Flow --      Pain Score 12/19/22 1638 0     Pain Loc --      Pain Education --      Exclude from Growth Chart --    No data found.  Updated Vital Signs BP (!) 143/92 (BP Location: Right Arm)   Pulse 73   Temp 98.2 F (36.8 C) (Oral)   Resp 18   Ht 6\' 1"  (1.854 m)   Wt (!) 315 lb (142.9 kg)   SpO2 94%   BMI 41.56 kg/m   Visual Acuity Right Eye Distance:   Left Eye Distance:   Bilateral Distance:    Right Eye Near:   Left Eye Near:    Bilateral Near:     Physical Exam Vitals and nursing note reviewed.  Constitutional:      General: He is not in acute distress.    Appearance: He is well-developed and well-groomed.  HENT:     Head: Normocephalic and atraumatic.  Eyes:     Conjunctiva/sclera: Conjunctivae normal.  Cardiovascular:     Rate and Rhythm: Normal rate and regular rhythm.     Pulses: Normal pulses.     Heart sounds: Normal heart sounds. No murmur heard. Pulmonary:     Effort: Pulmonary effort is normal. No respiratory distress.     Breath sounds: Normal breath sounds and air entry.  Abdominal:     Palpations: Abdomen is soft.     Tenderness: There is no abdominal tenderness.  Musculoskeletal:        General: No swelling.     Cervical back: Neck supple.  Skin:    General: Skin is warm and dry.     Capillary Refill: Capillary refill takes less than 2 seconds.  Neurological:     General: No focal deficit present.     Mental Status: He is alert and oriented to person, place, and time.     GCS: GCS eye subscore is 4. GCS  verbal subscore is 5. GCS motor subscore is 6.  Psychiatric:        Attention  and Perception: Attention normal.        Mood and Affect: Mood normal.        Speech: Speech normal.        Behavior: Behavior normal. Behavior is cooperative.      UC Treatments / Results  Labs (all labs ordered are listed, but only abnormal results are displayed) Labs Reviewed - No data to display  EKG   Radiology DG Chest 2 View  Result Date: 12/19/2022 CLINICAL DATA:  Cough.  Wheezing and shortness of breath. EXAM: CHEST - 2 VIEW COMPARISON:  Chest two views 01/01/2018 FINDINGS: Cardiac silhouette and mediastinal contours are within normal limits. The lungs are clear. Improved aeration of the lingula compared to prior. No pleural effusion pneumothorax. No acute skeletal abnormality. IMPRESSION: No active cardiopulmonary disease. Electronically Signed   By: Neita Garnet M.D.   On: 12/19/2022 18:44    Procedures Procedures (including critical care time)  Medications Ordered in UC Medications - No data to display  Initial Impression / Assessment and Plan / UC Course  I have reviewed the triage vital signs and the nursing notes.  Pertinent labs & imaging results that were available during my care of the patient were reviewed by me and considered in my medical decision making (see chart for details).  Clinical Course as of 12/19/22 1957  Tue Dec 19, 2022  1712 Cxr ordered for cough,wheezing, sob x 1 week [JD]  1725 Xray pending result [JD]  1838 Called radiology for report [JD]    Clinical Course User Index [JD] Alonda Weaber, Para March, NP    Ddx: Viral uri w cough,asthma exacerbation, allergies Final Clinical Impressions(s) / UC Diagnoses   Final diagnoses:  Mild intermittent asthma with exacerbation  Viral URI with cough     Discharge Instructions      Your chest x-ray was negative, rest, push fluids, take cough medicine and prednisone as directed use your inhaler as directed, follow-up  with PCP in 3 days if symptoms persist.  Return as needed.  If you develop chest pain, palpitations, worsening shortness of breath got to Er for further evaluation.      ED Prescriptions     Medication Sig Dispense Auth. Provider   predniSONE (DELTASONE) 10 MG tablet Take 2 tablets (20 mg total) by mouth daily with breakfast. X 5 days, Disp # 10, no refills 10 tablet Llana Deshazo, NP   brompheniramine-pseudoephedrine-DM 30-2-10 MG/5ML syrup Take 5 mLs by mouth 3 (three) times daily as needed. 118 mL Gryffin Altice, Para March, NP      PDMP not reviewed this encounter.   Clancy Gourd, NP 12/19/22 [phone]

## 2022-12-19 NOTE — Discharge Instructions (Addendum)
Your chest x-ray was negative, rest, push fluids, take cough medicine and prednisone as directed use your inhaler as directed, follow-up with PCP in 3 days if symptoms persist.  Return as needed.  If you develop chest pain, palpitations, worsening shortness of breath got to Er for further evaluation.

## 2022-12-19 NOTE — ED Triage Notes (Signed)
Pt c/o cough,wheezing & sob x1 wk. States frequent Cough(phlegm), wheezing, labored breathing after minor activity.  History of Asthma and Bronchitis - Entered by patient

## 2023-01-05 ENCOUNTER — Other Ambulatory Visit: Payer: BC Managed Care – PPO

## 2023-01-12 ENCOUNTER — Encounter: Payer: Self-pay | Admitting: Nurse Practitioner

## 2023-01-12 ENCOUNTER — Other Ambulatory Visit: Payer: Self-pay

## 2023-01-12 ENCOUNTER — Ambulatory Visit: Payer: BC Managed Care – PPO | Admitting: Nurse Practitioner

## 2023-01-12 VITALS — BP 122/84 | HR 95 | Temp 97.8°F | Resp 18 | Ht 73.0 in | Wt 327.8 lb

## 2023-01-12 DIAGNOSIS — J4520 Mild intermittent asthma, uncomplicated: Secondary | ICD-10-CM | POA: Diagnosis not present

## 2023-01-12 DIAGNOSIS — R052 Subacute cough: Secondary | ICD-10-CM

## 2023-01-12 DIAGNOSIS — N3000 Acute cystitis without hematuria: Secondary | ICD-10-CM | POA: Diagnosis not present

## 2023-01-12 MED ORDER — PROMETHAZINE-DM 6.25-15 MG/5ML PO SYRP: 5.0000 mL | ORAL_SOLUTION | Freq: Four times a day (QID) | ORAL | 0 refills | Status: DC | PRN

## 2023-01-12 NOTE — Progress Notes (Addendum)
BP 122/84   Pulse 95   Temp 97.8 F (36.6 C) (Oral)   Resp 18   Ht 6\' 1"  (1.854 m)   Wt (!) 327 lb 12.8 oz (148.7 kg)   SpO2 96%   BMI 43.25 kg/m    Subjective:    Patient ID: Jeffrey Cohen, male    DOB: January 14, 1976, 47 y.o.   MRN: [id number]  HPI: Jeffrey Cohen is a 47 y.o. male  Chief Complaint  Patient presents with   Cough    Lingering cough   Follow-up    ER visit for prostate infection   ER follow up/uti/asthma: patient was seen in [facility] ER on 12/31/2022. Er note: Jeffrey Cohen is a 47 y.o. male with a past medical history of asthma, HTN, HLD and OSA (on CPAP) who presents with cough. The patient reports 3 weeks of persistent productive cough and myalgias which acutely worsened at 1700 today with new onset of fever (Tmax 101.54F). Additionally, he endorses 1 day of dysuria, weak stream, and urinary urgency. No penile or rectal discharge. No pain with defecation. No skin lesions or skin rashes. Tolerating PO intake. Denies suprapubic pain, nausea or emesis. No flank pain. Patient reports 1 male sexual partner.  Per chart review, the patient sought care at [facility] on 12/19/22 where he was prescribed prednisone and cough medicine with instruction to follow up with his PCP.  Patient was prescribed keflex.   Patient reports he has had a cough for about a month. He says he is coughing up a lot of mucous.  He says that the coughing is worse at night. He reports he did have a fever was seen in er and was diagnosed with uti.  He says that he is getting short of breath with activity.     Patient has an albuterol inhaler but has not helped much.  Will give sample of trelegy.  1 inhalation daily.  Rinse and spit with water after.  Will also send in phenergan DM for cough.    Relevant past medical, surgical, family and social history reviewed and updated as indicated. Interim medical history since our last visit reviewed. Allergies and medications reviewed and  updated.  Review of Systems  Constitutional: Negative for fever or weight change.  Respiratory: positive for cough and shortness of breath.   Cardiovascular: Negative for chest pain or palpitations.  Gastrointestinal: Negative for abdominal pain, no bowel changes.  Musculoskeletal: Negative for gait problem or joint swelling.  Skin: Negative for rash.  Neurological: Negative for dizziness or headache.  No other specific complaints in a complete review of systems (except as listed in HPI above).      Objective:    BP 122/84   Pulse 95   Temp 97.8 F (36.6 C) (Oral)   Resp 18   Ht 6\' 1"  (1.854 m)   Wt (!) 327 lb 12.8 oz (148.7 kg)   SpO2 96%   BMI 43.25 kg/m   Wt Readings from Last 3 Encounters:  01/12/23 (!) 327 lb 12.8 oz (148.7 kg)  12/19/22 (!) 315 lb (142.9 kg)  10/16/22 (!) 330 lb 6.4 oz (149.9 kg)    Physical Exam  Constitutional: Patient appears well-developed and well-nourished. Obese  No distress.  HEENT: head atraumatic, normocephalic, pupils equal and reactive to light, neck supple Cardiovascular: Normal rate, regular rhythm and normal heart sounds.  No murmur heard. No BLE edema. Pulmonary/Chest: Effort normal and breath sounds normal. No respiratory distress. Abdominal: Soft.  There is no tenderness. Psychiatric: Patient has a normal mood and affect. behavior is normal. Judgment and thought content normal.  Results for orders placed or performed in visit on 10/16/22  CBC with Differential/Platelet  Result Value Ref Range   WBC 4.5 3.8 - 10.8 Thousand/uL   RBC 4.95 4.20 - 5.80 Million/uL   Hemoglobin 13.7 13.2 - 17.1 g/dL   HCT 74.2 59.5 - 63.8 %   MCV 86.1 80.0 - 100.0 fL   MCH 27.7 27.0 - 33.0 pg   MCHC 32.2 32.0 - 36.0 g/dL   RDW 75.6 43.3 - 29.5 %   Platelets 274 140 - 400 Thousand/uL   MPV 10.0 7.5 - 12.5 fL   Neutro Abs 2,313 1,500 - 7,800 cells/uL   Lymphs Abs 1,557 850 - 3,900 cells/uL   Absolute Monocytes 351 200 - 950 cells/uL   Eosinophils  Absolute 248 15 - 500 cells/uL   Basophils Absolute 32 0 - 200 cells/uL   Neutrophils Relative % 51.4 %   Total Lymphocyte 34.6 %   Monocytes Relative 7.8 %   Eosinophils Relative 5.5 %   Basophils Relative 0.7 %  COMPLETE METABOLIC PANEL WITH GFR  Result Value Ref Range   Glucose, Bld 109 (H) 65 - 99 mg/dL   BUN 10 7 - 25 mg/dL   Creat 1.88 4.16 - 6.06 mg/dL   eGFR 98 > OR = 60 TK/ZSW/1.09N2   BUN/Creatinine Ratio SEE NOTE: 6 - 22 (calc)   Sodium 139 135 - 146 mmol/L   Potassium 4.1 3.5 - 5.3 mmol/L   Chloride 101 98 - 110 mmol/L   CO2 29 20 - 32 mmol/L   Calcium 9.2 8.6 - 10.3 mg/dL   Total Protein 7.0 6.1 - 8.1 g/dL   Albumin 3.9 3.6 - 5.1 g/dL   Globulin 3.1 1.9 - 3.7 g/dL (calc)   AG Ratio 1.3 1.0 - 2.5 (calc)   Total Bilirubin 0.5 0.2 - 1.2 mg/dL   Alkaline phosphatase (APISO) 93 36 - 130 U/L   AST 16 10 - 40 U/L   ALT 9 9 - 46 U/L  Lipid panel  Result Value Ref Range   Cholesterol 158 <200 mg/dL   HDL 50 > OR = 40 mg/dL   Triglycerides 90 <355 mg/dL   LDL Cholesterol (Calc) 90 mg/dL (calc)   Total CHOL/HDL Ratio 3.2 <5.0 (calc)   Non-HDL Cholesterol (Calc) 108 <130 mg/dL (calc)  Hemoglobin D3U  Result Value Ref Range   Hgb A1c MFr Bld 5.8 (H) <5.7 % of total Hgb   Mean Plasma Glucose 120 mg/dL   eAG (mmol/L) 6.6 mmol/L      Assessment & Plan:   Problem List Items Addressed This Visit       Respiratory   Mild intermittent asthma without complication    Start trelegy daily.        Other Visit Diagnoses     Subacute cough    -  Primary   recenlty had negative chest xray,  start trelegy,  and phenergan DM.  folllow up if no improvement   Relevant Medications   promethazine-dextromethorphan (PROMETHAZINE-DM) 6.25-15 MG/5ML syrup   Acute cystitis without hematuria       patient reports symptoms have resolved.  continue keflex.        Follow up plan: Return if symptoms worsen or fail to improve.

## 2023-01-12 NOTE — Assessment & Plan Note (Signed)
Start trelegy daily.

## 2023-02-02 ENCOUNTER — Other Ambulatory Visit: Payer: BC Managed Care – PPO

## 2023-02-05 ENCOUNTER — Other Ambulatory Visit: Payer: Self-pay | Admitting: Family Medicine

## 2023-02-05 DIAGNOSIS — E7849 Other hyperlipidemia: Secondary | ICD-10-CM

## 2023-03-02 ENCOUNTER — Other Ambulatory Visit: Payer: BC Managed Care – PPO

## 2023-03-07 ENCOUNTER — Ambulatory Visit: Payer: BC Managed Care – PPO | Admitting: Podiatry

## 2023-03-19 ENCOUNTER — Ambulatory Visit: Payer: BC Managed Care – PPO | Admitting: Podiatry

## 2023-04-02 ENCOUNTER — Ambulatory Visit: Payer: BC Managed Care – PPO | Admitting: Podiatry

## 2023-05-08 ENCOUNTER — Other Ambulatory Visit: Payer: Self-pay | Admitting: Nurse Practitioner

## 2023-05-08 DIAGNOSIS — E7849 Other hyperlipidemia: Secondary | ICD-10-CM

## 2023-05-08 NOTE — Telephone Encounter (Signed)
Requested Prescriptions  Pending Prescriptions Disp Refills   atorvastatin (LIPITOR) 20 MG tablet [Pharmacy Med Name: Atorvastatin Calcium 20 MG Oral Tablet] 90 tablet 1    Sig: Take 1 tablet by mouth once daily     Cardiovascular:  Antilipid - Statins Failed - 05/08/2023  5:39 PM      Failed - Lipid Panel in normal range within the last 12 months    Cholesterol  Date Value Ref Range Status  10/16/2022 158 <200 mg/dL Final   LDL Cholesterol (Calc)  Date Value Ref Range Status  10/16/2022 90 mg/dL (calc) Final    Comment:    Reference range: <100 . Desirable range <100 mg/dL for primary prevention;   <70 mg/dL for patients with CHD or diabetic patients  with > or = 2 CHD risk factors. Marland Kitchen LDL-C is now calculated using the Martin-Hopkins  calculation, which is a validated novel method providing  better accuracy than the Friedewald equation in the  estimation of LDL-C.  Horald Pollen et al. Lenox Ahr. 4098;119(14): 2061-2068  (http://education.[facility].com/faq/FAQ164)    HDL  Date Value Ref Range Status  10/16/2022 50 > OR = 40 mg/dL Final   Triglycerides  Date Value Ref Range Status  10/16/2022 90 <150 mg/dL Final         Passed - Patient is not pregnant      Passed - Valid encounter within last 12 months    Recent Outpatient Visits           3 months ago Subacute cough   [facility] Berniece Salines, FNP   6 months ago Essential hypertension, benign   [facility] Berniece Salines, FNP   1 year ago Essential hypertension, benign   [facility] Berniece Salines, FNP   1 year ago Essential hypertension, benign   [facility] Berniece Salines, FNP   2 years ago Essential hypertension, benign   [facility] Caro Laroche, DO

## 2023-06-01 IMAGING — US US CAROTID DUPLEX BILAT
1 series · 13 of 24 positions shown · non-contrast
Comparison: None.

CLINICAL DATA: Hypertension, TIA symptoms, hyperlipidemia

EXAM:
BILATERAL CAROTID DUPLEX ULTRASOUND
TECHNIQUE: Gray scale imaging, color Doppler and duplex ultrasound were
performed of bilateral carotid and vertebral arteries in the neck.

[Series 1: us carotid duplex bilat · 0.06mm/px · 13 of 60 slices shown]
[im 1/60]
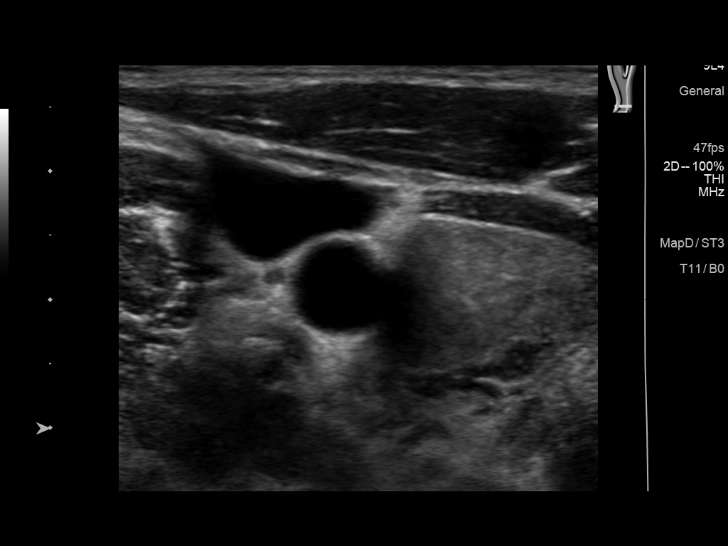
[im 6/60]
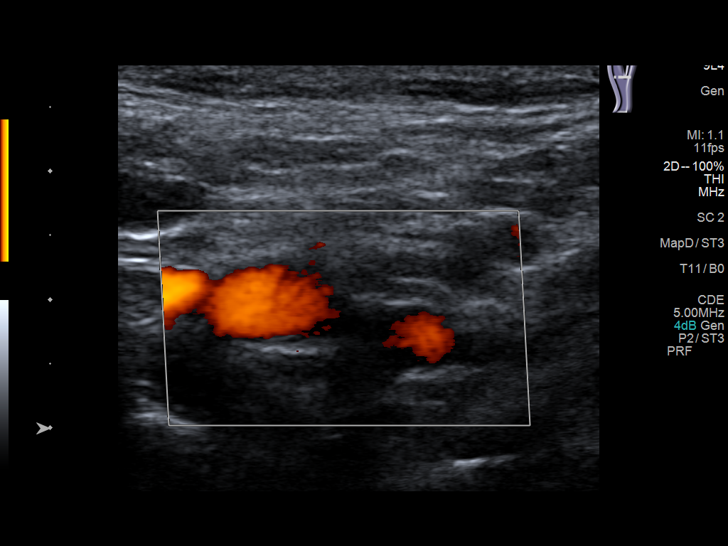
[im 11/60]
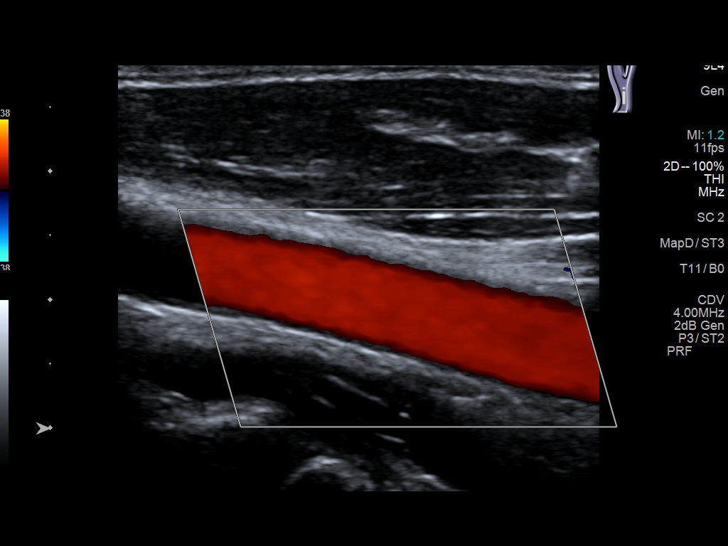
[im 16/60]
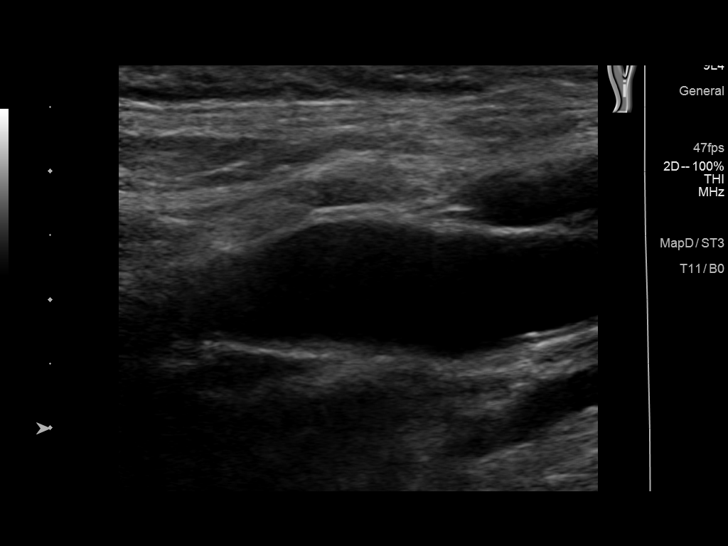
[im 21/60]
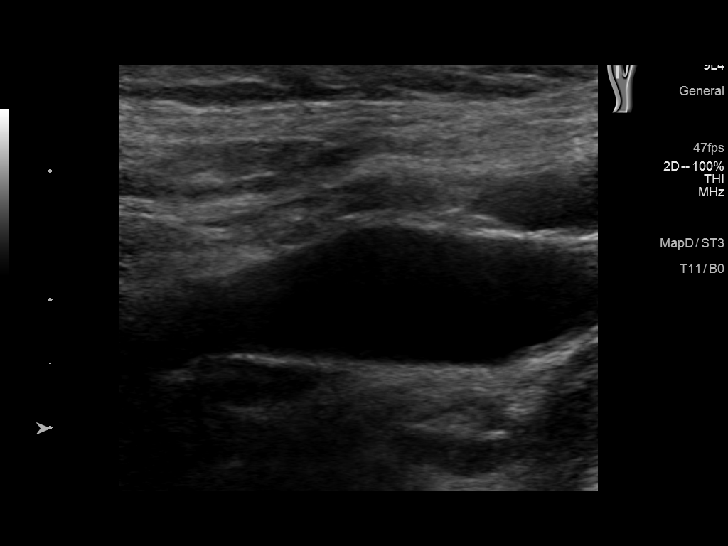
[im 26/60]
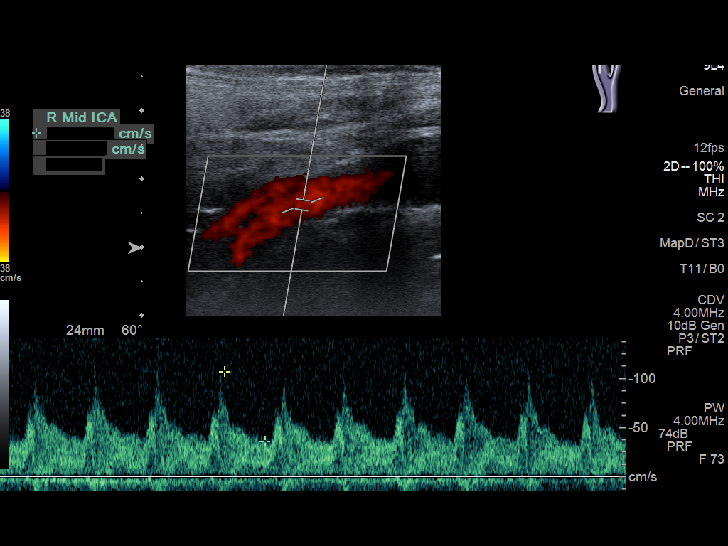
[im 31/60]
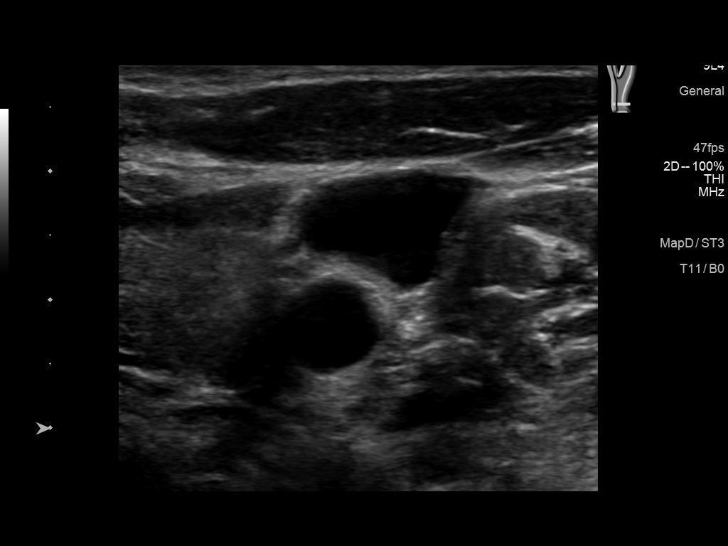
[im 34/60]
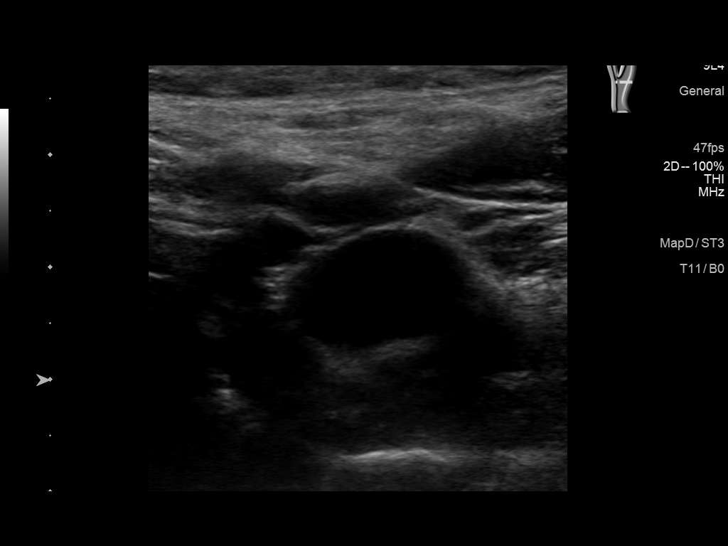
[im 39/60]
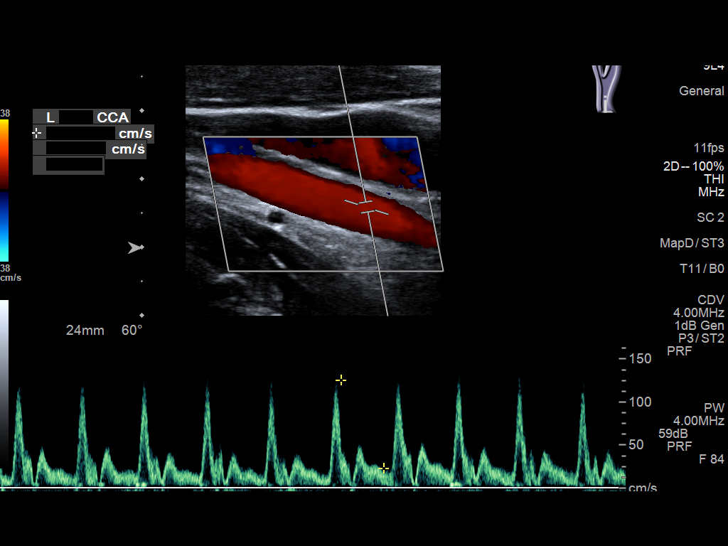
[im 44/60]
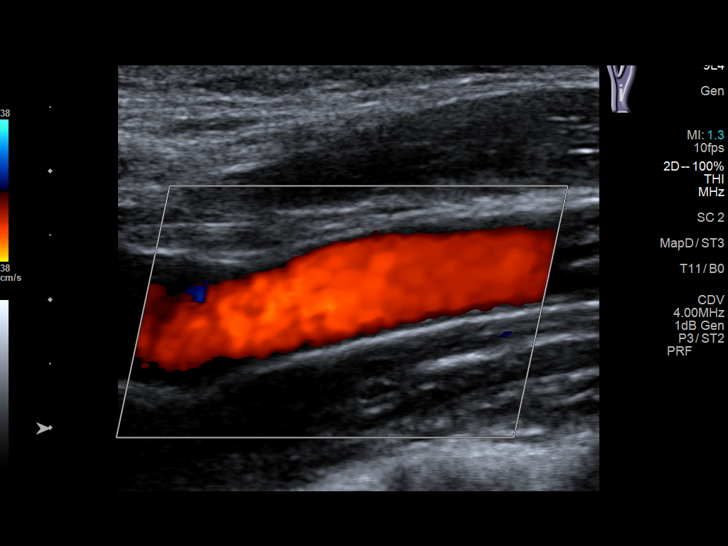
[im 49/60]
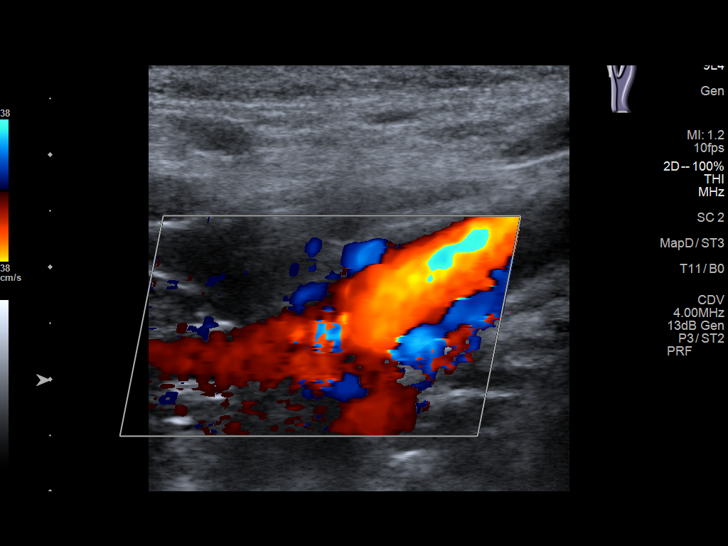
[im 54/60]
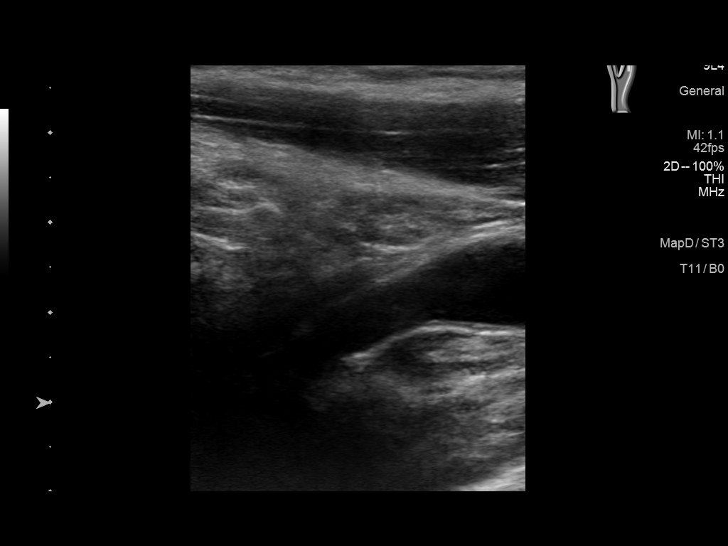
[im 60/60]
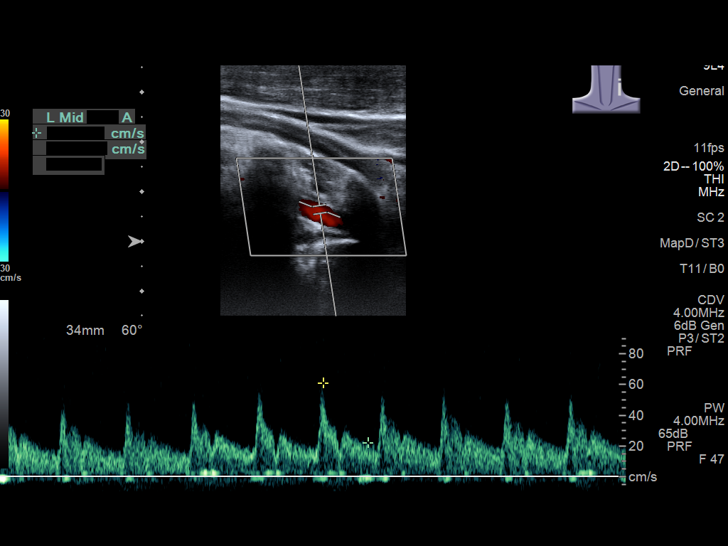

[13 of 24 positions shown; findings below may reference images not displayed]

FINDINGS: Criteria: Quantification of carotid stenosis is based on velocity
parameters that correlate the residual internal carotid diameter
with NASCET-based stenosis levels, using the diameter of the distal
internal carotid lumen as the denominator for stenosis measurement.

The following velocity measurements were obtained:

RIGHT

ICA: 128/30 cm/sec

CCA: 107/31 cm/sec

SYSTOLIC ICA/CCA RATIO:

ECA: 115 cm/sec

LEFT

ICA: 97/20 cm/sec

CCA: 103/22 cm/sec

SYSTOLIC ICA/CCA RATIO:

ECA: 103 cm/sec

RIGHT CAROTID ARTERY: Trace intimal thickening/minimal bifurcation
hypoechoic atherosclerosis. No hemodynamically significant right ICA
stenosis, velocity elevation, or turbulent flow. Degree of narrowing
less than 50%.

RIGHT VERTEBRAL ARTERY:  Normal antegrade flow

LEFT CAROTID ARTERY: Trace intimal thickening/minimal bifurcation
hypoechoic atherosclerosis. No hemodynamically significant left ICA
stenosis, velocity elevation, or turbulent flow.

LEFT VERTEBRAL ARTERY:  Normal antegrade flow
IMPRESSION: Trace carotid bifurcation atherosclerosis. Negative for stenosis.
Degree of narrowing less than 50% bilaterally by ultrasound
criteria.

Patent antegrade vertebral flow bilaterally

## 2023-06-15 ENCOUNTER — Telehealth: Payer: Self-pay | Admitting: Podiatry

## 2023-06-15 DIAGNOSIS — Z0271 Encounter for disability determination: Secondary | ICD-10-CM

## 2023-06-15 NOTE — Telephone Encounter (Signed)
 Faxed [facility] FMLA/intermittent  to  [phone] per [facility] office/Dr. Al Corpus

## 2023-06-29 ENCOUNTER — Other Ambulatory Visit: Payer: Self-pay | Admitting: Nurse Practitioner

## 2023-06-29 DIAGNOSIS — I10 Essential (primary) hypertension: Secondary | ICD-10-CM

## 2023-06-29 NOTE — Telephone Encounter (Signed)
 Copied from CRM [id number]. Topic: Clinical - Medication Refill >> Jun 29, 2023 12:37 PM Gery Pray wrote: Most Recent Primary Care Visit:  Provider: Della Goo F  Department: [facility]  Visit Type: OFFICE VISIT  Date: 01/12/2023  Medication: metoprolol succinate (TOPROL-XL) 25 MG 24 hr tablet  Has the patient contacted their pharmacy? No (Agent: If no, request that the patient contact the pharmacy for the refill. If patient does not wish to contact the pharmacy document the reason why and proceed with request.) (Agent: If yes, when and what did the pharmacy advise?) Neuro originally wrote the prescription   Is this the correct pharmacy for this prescription? Yes If no, delete pharmacy and type the correct one.  This is the patient's preferred pharmacy:  [facility] [address], [place] - [address] [place] [address] Phone: [phone] Fax: [phone]   Has the prescription been filled recently? No  Is the patient out of the medication? No 1 pill remaining   Has the patient been seen for an appointment in the last year OR does the patient have an upcoming appointment? Yes  Can we respond through MyChart? Yes  Agent: Please be advised that Rx refills may take up to 3 business days. We ask that you follow-up with your pharmacy.

## 2023-07-02 MED ORDER — METOPROLOL SUCCINATE ER 25 MG PO TB24: 25.0000 mg | ORAL_TABLET | Freq: Every day | ORAL | 0 refills | Status: DC

## 2023-07-02 NOTE — Telephone Encounter (Signed)
 Patient must keep upcoming appointment for additional refills. Requested Prescriptions  Pending Prescriptions Disp Refills   metoprolol succinate (TOPROL-XL) 25 MG 24 hr tablet 30 tablet 0    Sig: Take 1 tablet (25 mg total) by mouth daily.     Cardiovascular:  Beta Blockers Failed - 07/02/2023  8:38 AM      Failed - Valid encounter within last 6 months    Recent Outpatient Visits   None            Passed - Last BP in normal range    BP Readings from Last 1 Encounters:  01/12/23 122/84         Passed - Last Heart Rate in normal range    Pulse Readings from Last 1 Encounters:  01/12/23 95

## 2023-07-10 ENCOUNTER — Ambulatory Visit: Admitting: Nurse Practitioner

## 2023-07-10 VITALS — BP 132/76 | HR 91 | Temp 98.3°F | Resp 20 | Ht 73.0 in | Wt 322.0 lb

## 2023-07-10 DIAGNOSIS — Z131 Encounter for screening for diabetes mellitus: Secondary | ICD-10-CM | POA: Diagnosis not present

## 2023-07-10 DIAGNOSIS — E7849 Other hyperlipidemia: Secondary | ICD-10-CM | POA: Diagnosis not present

## 2023-07-10 DIAGNOSIS — J4520 Mild intermittent asthma, uncomplicated: Secondary | ICD-10-CM

## 2023-07-10 DIAGNOSIS — I10 Essential (primary) hypertension: Secondary | ICD-10-CM

## 2023-07-10 DIAGNOSIS — Z6841 Body Mass Index (BMI) 40.0 and over, adult: Secondary | ICD-10-CM

## 2023-07-10 DIAGNOSIS — E66813 Obesity, class 3: Secondary | ICD-10-CM

## 2023-07-10 DIAGNOSIS — R519 Headache, unspecified: Secondary | ICD-10-CM

## 2023-07-10 DIAGNOSIS — Z130 Encounter for screening for diseases of the blood and blood-forming organs and certain disorders involving the immune mechanism: Secondary | ICD-10-CM

## 2023-07-10 NOTE — Assessment & Plan Note (Signed)
 Patient reports condition as stable, no recent headaches

## 2023-07-10 NOTE — Progress Notes (Signed)
 BP 132/76   Pulse 91   Temp 98.3 F (36.8 C)   Resp 20   Ht 6\' 1"  (1.854 m)   Wt (!) 322 lb (146.1 kg)   SpO2 97%   BMI 42.48 kg/m    Subjective:    Patient ID: Jeffrey Cohen, male    DOB: Dec 23, 1975, 48 y.o.   MRN: [id number]  HPI: Jeffrey Cohen is a 48 y.o. male  URI Compliant: symptoms started 1 day ago -Fever: denies -Cough: present and productive, clear/yellowish -Shortness of breath: yes -Wheezing: mild -Chest congestion: present -Nasal congestion: present -Runny nose: denies -Post nasal drip: present -Sore throat: scratchy -Sinus pressure: denies -Headache: denies -Face pain: denies -Ear pain:  denies -Ear pressure: denies -Relief with OTC cold/cough medications: yes, Nyquil helps at night - has not used inhaler,  but will when he gets home.  Hypertension:  -Medications: losartan -hydrochlorothiazide  100-12.5 mg daily -Patient is compliant with above medications and reports no side effects. -Checking BP at home (average): periodically, 132/76 -Highest BP at home: 150/95 -Lowest BP at home: 132/76 -Denies any SOB, CP, vision changes, LE edema or symptoms of hypotension -Diet: working on well balanced diet -Exercise: walks at work each day  HLD:  -Medications: lipitor 20 mg daily -Patient is compliant with above medications and reports no side effects.  -Last lipid panel: 09/2022 Lipid Panel     Component Value Date/Time   CHOL 158 10/16/2022 0825   TRIG 90 10/16/2022 0825   HDL 50 10/16/2022 0825   CHOLHDL 3.2 10/16/2022 0825   VLDL 19 10/18/2016 1135   LDLCALC 90 10/16/2022 0825     Asthma:  -Asthma status: stable -Current medications: Albuterol  108/90 base inhaler - rescue -Satisfied with current treatment?: yes -Dyspnea frequency: not frequent -Cough frequency: not frequent -recent hospitalizations: none -Influenza: does not get vaccine  Obesity:  Current weight : 322 lbs BMI: 42.48 Highest weight:327 lbs Treatment  Tried: lifestyle modifications  Comorbidities: HTN, HLD, Asthma       01/12/2023    2:59 PM 10/16/2022    8:07 AM 02/08/2022    2:25 PM  Depression screen PHQ 2/9  Decreased Interest 0 0 0  Down, Depressed, Hopeless 0 0 0  PHQ - 2 Score 0 0 0    Relevant past medical, surgical, family and social history reviewed and updated as indicated. Interim medical history since our last visit reviewed. Allergies and medications reviewed and updated.  Review of Systems Constitutional: Negative for fever or weight change.  Respiratory: reports cough and mild shortness of breath  Cardiovascular: Negative for chest pain or palpitations.  Gastrointestinal: Negative for abdominal pain, no bowel changes.  Musculoskeletal: Negative for gait problem or joint swelling.  Skin: Negative for rash.  Neurological: Negative for dizziness or headache.  No other specific complaints in a complete review of systems (except as listed in HPI above).  Per HPI unless specifically indicated above     Objective:    BP 132/76   Pulse 91   Temp 98.3 F (36.8 C)   Resp 20   Ht 6\' 1"  (1.854 m)   Wt (!) 322 lb (146.1 kg)   SpO2 97%   BMI 42.48 kg/m    Wt Readings from Last 3 Encounters:  07/10/23 (!) 322 lb (146.1 kg)  01/12/23 (!) 327 lb 12.8 oz (148.7 kg)  12/19/22 (!) 315 lb (142.9 kg)    Physical Exam Vitals reviewed.  Constitutional:      Appearance: Normal appearance.  HENT:     Head: Normocephalic.     Right Ear: Tympanic membrane normal.     Left Ear: Tympanic membrane normal.     Nose: Nose normal.     Right Sinus: No maxillary sinus tenderness or frontal sinus tenderness.     Left Sinus: No maxillary sinus tenderness or frontal sinus tenderness.     Mouth/Throat:     Mouth: Mucous membranes are moist.     Pharynx: Oropharynx is clear. No oropharyngeal exudate or posterior oropharyngeal erythema.  Eyes:     Extraocular Movements: Extraocular movements intact.     Conjunctiva/sclera:  Conjunctivae normal.     Pupils: Pupils are equal, round, and reactive to light.  Cardiovascular:     Rate and Rhythm: Normal rate and regular rhythm.  Pulmonary:     Effort: Pulmonary effort is normal.     Breath sounds: Wheezing present.  Lymphadenopathy:     Cervical: No cervical adenopathy.  Skin:    General: Skin is warm and dry.  Neurological:     General: No focal deficit present.     Mental Status: He is alert and oriented to person, place, and time. Mental status is at baseline.  Psychiatric:        Mood and Affect: Mood normal.        Behavior: Behavior normal.        Thought Content: Thought content normal.        Judgment: Judgment normal.     Results for orders placed or performed in visit on 10/16/22  CBC with Differential/Platelet   Collection Time: 10/16/22  8:25 AM  Result Value Ref Range   WBC 4.5 3.8 - 10.8 Thousand/uL   RBC 4.95 4.20 - 5.80 Million/uL   Hemoglobin 13.7 13.2 - 17.1 g/dL   HCT 86.5 78.4 - 69.6 %   MCV 86.1 80.0 - 100.0 fL   MCH 27.7 27.0 - 33.0 pg   MCHC 32.2 32.0 - 36.0 g/dL   RDW 29.5 28.4 - 13.2 %   Platelets 274 140 - 400 Thousand/uL   MPV 10.0 7.5 - 12.5 fL   Neutro Abs 2,313 1,500 - 7,800 cells/uL   Lymphs Abs 1,557 850 - 3,900 cells/uL   Absolute Monocytes 351 200 - 950 cells/uL   Eosinophils Absolute 248 15 - 500 cells/uL   Basophils Absolute 32 0 - 200 cells/uL   Neutrophils Relative % 51.4 %   Total Lymphocyte 34.6 %   Monocytes Relative 7.8 %   Eosinophils Relative 5.5 %   Basophils Relative 0.7 %  COMPLETE METABOLIC PANEL WITH GFR   Collection Time: 10/16/22  8:25 AM  Result Value Ref Range   Glucose, Bld 109 (H) 65 - 99 mg/dL   BUN 10 7 - 25 mg/dL   Creat 4.40 1.02 - 7.25 mg/dL   eGFR 98 > OR = 60 DG/UYQ/0.34V4   BUN/Creatinine Ratio SEE NOTE: 6 - 22 (calc)   Sodium 139 135 - 146 mmol/L   Potassium 4.1 3.5 - 5.3 mmol/L   Chloride 101 98 - 110 mmol/L   CO2 29 20 - 32 mmol/L   Calcium  9.2 8.6 - 10.3 mg/dL   Total  Protein 7.0 6.1 - 8.1 g/dL   Albumin 3.9 3.6 - 5.1 g/dL   Globulin 3.1 1.9 - 3.7 g/dL (calc)   AG Ratio 1.3 1.0 - 2.5 (calc)   Total Bilirubin 0.5 0.2 - 1.2 mg/dL   Alkaline phosphatase (APISO) 93 36 - 130 U/L  AST 16 10 - 40 U/L   ALT 9 9 - 46 U/L  Lipid panel   Collection Time: 10/16/22  8:25 AM  Result Value Ref Range   Cholesterol 158 <200 mg/dL   HDL 50 > OR = 40 mg/dL   Triglycerides 90 <161 mg/dL   LDL Cholesterol (Calc) 90 mg/dL (calc)   Total CHOL/HDL Ratio 3.2 <5.0 (calc)   Non-HDL Cholesterol (Calc) 108 <130 mg/dL (calc)  Hemoglobin W9U   Collection Time: 10/16/22  8:25 AM  Result Value Ref Range   Hgb A1c MFr Bld 5.8 (H) <5.7 % of total Hgb   Mean Plasma Glucose 120 mg/dL   eAG (mmol/L) 6.6 mmol/L       Assessment & Plan:   Problem List Items Addressed This Visit       Cardiovascular and Mediastinum   Essential hypertension, benign (Chronic)   Patient is currently taking lasartan-hydrochlorothiazide  100-12.5mg  daily, and denies side effects. BP is stable        Respiratory   Mild intermittent asthma without complication   Patient is ill with a mild URI, mild wheezing noted in right upper lung. Patient reports some relief with OTC Nyquil at night. Patient has albuterol  108 (90 base) inhaler PRN for rescue, reports that he has not taken it recently, but intends to take it today.        Other   Class 3 severe obesity with body mass index (BMI) of 40.0 to 44.9 in adult [facility])   Patient manages condition with lifestyle modifications. Comorbidities include HTN, HLD, asthma      Hyperlipidemia - Primary   Patient currently takes Lipitor 20 mg daily, without side effects. Labs ordered, patient is not feel good today and will return for lab draw when he feels better.      Relevant Orders   Comprehensive metabolic panel with GFR   Lipid panel   Headache disorder   Patient reports condition as stable, no recent headaches      Obesity, Class III, BMI 40-49.9  (morbid obesity) (HCC)   Patient currently manages condition with lifestyle modifications.       Other Visit Diagnoses       Screening for diabetes mellitus       Relevant Orders   Hemoglobin A1c     Screening for deficiency anemia       Relevant Orders   CBC with Differential/Platelet         Follow up plan: Return in about 6 months (around 01/09/2024) for follow up.

## 2023-07-10 NOTE — Assessment & Plan Note (Signed)
 Patient currently takes Lipitor 20 mg daily, without side effects. Labs ordered, patient is not feel good today and will return for lab draw when he feels better.

## 2023-07-10 NOTE — Assessment & Plan Note (Signed)
 Patient manages condition with lifestyle modifications. Comorbidities include HTN, HLD, asthma

## 2023-07-10 NOTE — Assessment & Plan Note (Signed)
 Patient currently manages condition with lifestyle modifications.

## 2023-07-10 NOTE — Assessment & Plan Note (Signed)
 Patient is currently taking lasartan-hydrochlorothiazide  100-12.5mg  daily, and denies side effects. BP is stable

## 2023-07-10 NOTE — Assessment & Plan Note (Signed)
 Patient is ill with a mild URI, mild wheezing noted in right upper lung. Patient reports some relief with OTC Nyquil at night. Patient has albuterol  108 (90 base) inhaler PRN for rescue, reports that he has not taken it recently, but intends to take it today.

## 2023-07-27 ENCOUNTER — Other Ambulatory Visit: Payer: Self-pay | Admitting: Nurse Practitioner

## 2023-07-27 DIAGNOSIS — I10 Essential (primary) hypertension: Secondary | ICD-10-CM

## 2023-07-30 NOTE — Telephone Encounter (Signed)
 Requested Prescriptions  Pending Prescriptions Disp Refills   metoprolol  succinate (TOPROL -XL) 25 MG 24 hr tablet [Pharmacy Med Name: Metoprolol  Succinate ER 25 MG Oral Tablet Extended Release 24 Hour] 90 tablet 1    Sig: Take 1 tablet by mouth once daily     Cardiovascular:  Beta Blockers Failed - 07/30/2023 12:28 PM      Failed - Valid encounter within last 6 months    Recent Outpatient Visits           2 weeks ago Other hyperlipidemia   [facility] Quinton Buckler, FNP              Passed - Last BP in normal range    BP Readings from Last 1 Encounters:  07/10/23 132/76         Passed - Last Heart Rate in normal range    Pulse Readings from Last 1 Encounters:  07/10/23 91

## 2023-09-04 ENCOUNTER — Other Ambulatory Visit: Payer: Self-pay | Admitting: Nurse Practitioner

## 2023-09-04 DIAGNOSIS — I10 Essential (primary) hypertension: Secondary | ICD-10-CM

## 2023-09-06 NOTE — Telephone Encounter (Signed)
 Requested medication (s) are due for refill today:   Yes  Requested medication (s) are on the active medication list:   Yes  Future visit scheduled:   No.   LOV 4/22 2025   Last ordered: 10/16/2022 #90, 1 refill  Unable to refill because labs are due per protocol.     Requested Prescriptions  Pending Prescriptions Disp Refills   losartan -hydrochlorothiazide  (HYZAAR) 100-12.5 MG tablet [Pharmacy Med Name: Losartan  Potassium-HCTZ 100-12.5 MG Oral Tablet] 90 tablet 0    Sig: Take 1 tablet by mouth once daily     Cardiovascular: ARB + Diuretic Combos Failed - 09/06/2023  3:09 PM      Failed - K in normal range and within 180 days    Potassium  Date Value Ref Range Status  10/16/2022 4.1 3.5 - 5.3 mmol/L Final         Failed - Na in normal range and within 180 days    Sodium  Date Value Ref Range Status  10/16/2022 139 135 - 146 mmol/L Final         Failed - Cr in normal range and within 180 days    Creat  Date Value Ref Range Status  10/16/2022 0.97 0.60 - 1.29 mg/dL Final         Failed - eGFR is 10 or above and within 180 days    GFR, Est African American  Date Value Ref Range Status  06/16/2020 105 > OR = 60 mL/min/1.47m2 Final   GFR, Est Non African American  Date Value Ref Range Status  06/16/2020 90 > OR = 60 mL/min/1.89m2 Final   eGFR  Date Value Ref Range Status  10/16/2022 98 > OR = 60 mL/min/1.48m2 Final         Failed - Valid encounter within last 6 months    Recent Outpatient Visits           1 month ago Other hyperlipidemia   [facility] Quinton Buckler, [place]              Passed - Patient is not pregnant      Passed - Last BP in normal range    BP Readings from Last 1 Encounters:  07/10/23 132/76

## 2023-10-10 ENCOUNTER — Telehealth: Payer: Self-pay | Admitting: Podiatry

## 2023-10-10 NOTE — Telephone Encounter (Signed)
 Recd [facility] forms from [facility] office for pt. Faxed [phone] intermittent leave

## 2023-11-05 ENCOUNTER — Other Ambulatory Visit: Payer: Self-pay | Admitting: Nurse Practitioner

## 2023-11-05 DIAGNOSIS — E7849 Other hyperlipidemia: Secondary | ICD-10-CM

## 2023-11-07 NOTE — Telephone Encounter (Signed)
 Requested Prescriptions  Pending Prescriptions Disp Refills   atorvastatin  (LIPITOR) 20 MG tablet [Pharmacy Med Name: Atorvastatin  Calcium  20 MG Oral Tablet] 90 tablet 0    Sig: Take 1 tablet by mouth once daily     Cardiovascular:  Antilipid - Statins Failed - 11/07/2023  8:28 AM      Failed - Lipid Panel in normal range within the last 12 months    Cholesterol  Date Value Ref Range Status  10/16/2022 158 <200 mg/dL Final   LDL Cholesterol (Calc)  Date Value Ref Range Status  10/16/2022 90 mg/dL (calc) Final    Comment:    Reference range: <100 . Desirable range <100 mg/dL for primary prevention;   <70 mg/dL for patients with CHD or diabetic patients  with > or = 2 CHD risk factors. SABRA LDL-C is now calculated using the Martin-Hopkins  calculation, which is a validated novel method providing  better accuracy than the Friedewald equation in the  estimation of LDL-C.  Gladis APPLETHWAITE et al. SANDREA. 7986;689(80): 2061-2068  (http://education.[facility].com/faq/FAQ164)    HDL  Date Value Ref Range Status  10/16/2022 50 > OR = 40 mg/dL Final   Triglycerides  Date Value Ref Range Status  10/16/2022 90 <150 mg/dL Final         Passed - Patient is not pregnant      Passed - Valid encounter within last 12 months    Recent Outpatient Visits           4 months ago Other hyperlipidemia   [facility] Gareth Mliss [place], [place]

## 2023-12-10 ENCOUNTER — Other Ambulatory Visit: Payer: Self-pay | Admitting: Nurse Practitioner

## 2023-12-10 DIAGNOSIS — I10 Essential (primary) hypertension: Secondary | ICD-10-CM

## 2023-12-11 NOTE — Telephone Encounter (Signed)
 Requested medication (s) are due for refill today: na   Requested medication (s) are on the active medication list: yes   Last refill:  09/06/23 #90 0 refills  Future visit scheduled: no   Notes to clinic:  protocol failed. Last labs 10/16/22 do you want to refill Rx?     Requested Prescriptions  Pending Prescriptions Disp Refills   losartan -hydrochlorothiazide  (HYZAAR) 100-12.5 MG tablet [Pharmacy Med Name: Losartan  Potassium-HCTZ 100-12.5 MG Oral Tablet] 90 tablet 0    Sig: Take 1 tablet by mouth once daily     Cardiovascular: ARB + Diuretic Combos Failed - 12/11/2023  4:09 PM      Failed - K in normal range and within 180 days    Potassium  Date Value Ref Range Status  10/16/2022 4.1 3.5 - 5.3 mmol/L Final         Failed - Na in normal range and within 180 days    Sodium  Date Value Ref Range Status  10/16/2022 139 135 - 146 mmol/L Final         Failed - Cr in normal range and within 180 days    Creat  Date Value Ref Range Status  10/16/2022 0.97 0.60 - 1.29 mg/dL Final         Failed - eGFR is 10 or above and within 180 days    GFR, Est African American  Date Value Ref Range Status  06/16/2020 105 > OR = 60 mL/min/1.84m2 Final   GFR, Est Non African American  Date Value Ref Range Status  06/16/2020 90 > OR = 60 mL/min/1.60m2 Final   eGFR  Date Value Ref Range Status  10/16/2022 98 > OR = 60 mL/min/1.30m2 Final         Passed - Patient is not pregnant      Passed - Last BP in normal range    BP Readings from Last 1 Encounters:  07/10/23 132/76         Passed - Valid encounter within last 6 months    Recent Outpatient Visits           5 months ago Other hyperlipidemia   [facility] Gareth Mliss [place], [place]

## 2024-01-30 ENCOUNTER — Other Ambulatory Visit: Payer: Self-pay | Admitting: Nurse Practitioner

## 2024-01-30 DIAGNOSIS — I10 Essential (primary) hypertension: Secondary | ICD-10-CM

## 2024-01-31 NOTE — Telephone Encounter (Signed)
 Courtesy refill. Patient will need an office visit for additional refills.  Requested Prescriptions  Pending Prescriptions Disp Refills   metoprolol  succinate (TOPROL -XL) 25 MG 24 hr tablet [Pharmacy Med Name: Metoprolol  Succinate ER 25 MG Oral Tablet Extended Release 24 Hour] 30 tablet 0    Sig: Take 1 tablet by mouth once daily     Cardiovascular:  Beta Blockers Failed - 01/31/2024  4:47 PM      Failed - Valid encounter within last 6 months    Recent Outpatient Visits           6 months ago Other hyperlipidemia   [facility] Gareth Mliss FAL[place], FNP              Passed - Last BP in normal range    BP Readings from Last 1 Encounters:  07/10/23 132/76         Passed - Last Heart Rate in normal range    Pulse Readings from Last 1 Encounters:  07/10/23 91

## 2024-02-09 ENCOUNTER — Other Ambulatory Visit: Payer: Self-pay | Admitting: Nurse Practitioner

## 2024-02-09 DIAGNOSIS — E7849 Other hyperlipidemia: Secondary | ICD-10-CM

## 2024-02-11 NOTE — Telephone Encounter (Signed)
 Pt overdue for a follow up

## 2024-02-11 NOTE — Telephone Encounter (Signed)
 Requested medication (s) are due for refill today: yes  Requested medication (s) are on the active medication list: yes  Last refill:  11/07/23  Future visit scheduled: yes  Notes to clinic:  Unable to refill per protocol due to failed labs, no updated results.      Requested Prescriptions  Pending Prescriptions Disp Refills   atorvastatin  (LIPITOR) 20 MG tablet [Pharmacy Med Name: Atorvastatin  Calcium  20 MG Oral Tablet] 90 tablet 0    Sig: Take 1 tablet by mouth once daily     Cardiovascular:  Antilipid - Statins Failed - 02/11/2024 12:47 PM      Failed - Lipid Panel in normal range within the last 12 months    Cholesterol  Date Value Ref Range Status  10/16/2022 158 <200 mg/dL Final   LDL Cholesterol (Calc)  Date Value Ref Range Status  10/16/2022 90 mg/dL (calc) Final    Comment:    Reference range: <100 . Desirable range <100 mg/dL for primary prevention;   <70 mg/dL for patients with CHD or diabetic patients  with > or = 2 CHD risk factors. SABRA LDL-C is now calculated using the Martin-Hopkins  calculation, which is a validated novel method providing  better accuracy than the Friedewald equation in the  estimation of LDL-C.  Gladis APPLETHWAITE et al. SANDREA. 7986;689(80): 2061-2068  (http://education.[facility].com/faq/FAQ164)    HDL  Date Value Ref Range Status  10/16/2022 50 > OR = 40 mg/dL Final   Triglycerides  Date Value Ref Range Status  10/16/2022 90 <150 mg/dL Final         Passed - Patient is not pregnant      Passed - Valid encounter within last 12 months    Recent Outpatient Visits           7 months ago Other hyperlipidemia   [facility] Gareth Mliss [place], [place]

## 2024-02-11 NOTE — Telephone Encounter (Signed)
 Appt sch'd with Julie for Dec 15. He is not out of medication at this time

## 2024-03-03 ENCOUNTER — Ambulatory Visit: Admitting: Nurse Practitioner

## 2024-03-31 ENCOUNTER — Ambulatory Visit: Admitting: Nurse Practitioner

## 2024-03-31 ENCOUNTER — Encounter: Payer: Self-pay | Admitting: Nurse Practitioner

## 2024-03-31 VITALS — BP 136/82 | HR 70 | Temp 98.0°F | Ht 73.0 in | Wt 327.0 lb

## 2024-03-31 DIAGNOSIS — Z131 Encounter for screening for diabetes mellitus: Secondary | ICD-10-CM | POA: Diagnosis not present

## 2024-03-31 DIAGNOSIS — Z1211 Encounter for screening for malignant neoplasm of colon: Secondary | ICD-10-CM | POA: Diagnosis not present

## 2024-03-31 DIAGNOSIS — Z6841 Body Mass Index (BMI) 40.0 and over, adult: Secondary | ICD-10-CM | POA: Diagnosis not present

## 2024-03-31 DIAGNOSIS — I10 Essential (primary) hypertension: Secondary | ICD-10-CM

## 2024-03-31 DIAGNOSIS — G4733 Obstructive sleep apnea (adult) (pediatric): Secondary | ICD-10-CM | POA: Diagnosis not present

## 2024-03-31 DIAGNOSIS — J4520 Mild intermittent asthma, uncomplicated: Secondary | ICD-10-CM

## 2024-03-31 DIAGNOSIS — R519 Headache, unspecified: Secondary | ICD-10-CM

## 2024-03-31 DIAGNOSIS — E66813 Obesity, class 3: Secondary | ICD-10-CM | POA: Diagnosis not present

## 2024-03-31 DIAGNOSIS — E7849 Other hyperlipidemia: Secondary | ICD-10-CM

## 2024-03-31 MED ORDER — UBROGEPANT 50 MG PO TABS: 50.0000 mg | ORAL_TABLET | ORAL | 1 refills | Status: AC | PRN

## 2024-03-31 MED ORDER — ATORVASTATIN CALCIUM 20 MG PO TABS: 20.0000 mg | ORAL_TABLET | Freq: Every day | ORAL | 1 refills | Status: AC

## 2024-03-31 MED ORDER — LOSARTAN POTASSIUM-HCTZ 100-12.5 MG PO TABS: 1.0000 | ORAL_TABLET | Freq: Every day | ORAL | 1 refills | Status: AC

## 2024-03-31 MED ORDER — METOPROLOL SUCCINATE ER 25 MG PO TB24: 25.0000 mg | ORAL_TABLET | Freq: Every day | ORAL | 1 refills | Status: AC

## 2024-03-31 NOTE — Progress Notes (Signed)
 "  BP 136/82   Pulse 70   Temp 98 F (36.7 C)   Ht 6' 1 (1.854 m)   Wt (!) 327 lb (148.3 kg)   SpO2 98%   BMI 43.14 kg/m    Subjective:    Patient ID: Jeffrey Cohen, male    DOB: 1975-10-22, 49 y.o.   MRN: [id number]  HPI: Jeffrey Cohen is a 49 y.o. male  Chief Complaint  Patient presents with   Medication Refill    Pt would also like to discuss ubrelvy .    Discussed the use of AI scribe software for clinical note transcription with the patient, who gave verbal consent to proceed.  History of Present Illness Jeffrey Cohen is a 49 year old male who presents for a routine follow-up. He is accompanied by his wife.  Hypertension - Treated with losartan -hydrochlorothiazide  100-12.5 mg daily and metoprolol  25 mg daily - No new symptoms or changes in blood pressure control  Asthma - Uses albuterol  inhaler as needed - No new respiratory symptoms or exacerbations  Obstructive sleep apnea - No new symptoms or changes in sleep quality reported  Obesity -  Wt Readings from Last 3 Encounters:  03/31/24 (!) 327 lb (148.3 kg)  07/10/23 (!) 322 lb (146.1 kg)  01/12/23 (!) 327 lb 12.8 oz (148.7 kg)   Body mass index is 43.14 kg/m.  Flowsheet Row Office Visit from 03/31/2024 in [facility]  1 51 inches   - Encourage continuation of lifestyle modifications, including dietary management and regular exercise. -continue to increase physical activity, getting at least 150 min of physical activity a week.  Work on including runner, broadcasting/film/video 2 days a week.  - continue eating at a calorie deficit 2000-2200 cal a day, eating a well balanced diet with whole foods, avoiding processed foods.   Patient is motivated to continue working on lifestyle modification.    Hyperlipidemia - Treated with atorvastatin  20 mg daily - Last lipid panel in July 2024 showed normal results  Glycemic control - Last A1c in July 2024 was 5.8  Headache  management - Uses Ubrelvy  as needed for headaches, prescribed by neurology  Erectile function - No erectile dysfunction, but sometimes requires increased effort  Preventive health maintenance - Overdue for blood work - Overdue for colorectal cancer screening         03/31/2024    1:51 PM 01/12/2023    2:59 PM 10/16/2022    8:07 AM  Depression screen PHQ 2/9  Decreased Interest 0 0 0  Down, Depressed, Hopeless 0 0 0  PHQ - 2 Score 0 0 0    Relevant past medical, surgical, family and social history reviewed and updated as indicated. Interim medical history since our last visit reviewed. Allergies and medications reviewed and updated.  Review of Systems  Constitutional: Negative for fever or weight change.  Respiratory: Negative for cough and shortness of breath.   Cardiovascular: Negative for chest pain or palpitations.  Gastrointestinal: Negative for abdominal pain, no bowel changes.  Musculoskeletal: Negative for gait problem or joint swelling.  Skin: Negative for rash.  Neurological: Negative for dizziness or headache.  No other specific complaints in a complete review of systems (except as listed in HPI above).      Objective:      BP 136/82   Pulse 70   Temp 98 F (36.7 C)   Ht 6' 1 (1.854 m)   Wt (!) 327 lb (148.3 kg)  SpO2 98%   BMI 43.14 kg/m    Wt Readings from Last 3 Encounters:  03/31/24 (!) 327 lb (148.3 kg)  07/10/23 (!) 322 lb (146.1 kg)  01/12/23 (!) 327 lb 12.8 oz (148.7 kg)    Physical Exam GENERAL: Alert, cooperative, well developed, no acute distress HEENT: Normocephalic, normal oropharynx, moist mucous membranes CHEST: Clear to auscultation bilaterally, no wheezes, rhonchi, or crackles CARDIOVASCULAR: Normal heart rate and rhythm, S1 and S2 normal without murmurs ABDOMEN: Soft, non-tender, non-distended, without organomegaly, normal bowel sounds EXTREMITIES: No cyanosis or edema NEUROLOGICAL: Cranial nerves grossly intact, moves all  extremities without gross motor or sensory deficit  Results for orders placed or performed in visit on 10/16/22  CBC with Differential/Platelet   Collection Time: 10/16/22  8:25 AM  Result Value Ref Range   WBC 4.5 3.8 - 10.8 Thousand/uL   RBC 4.95 4.20 - 5.80 Million/uL   Hemoglobin 13.7 13.2 - 17.1 g/dL   HCT 57.3 61.4 - 49.9 %   MCV 86.1 80.0 - 100.0 fL   MCH 27.7 27.0 - 33.0 pg   MCHC 32.2 32.0 - 36.0 g/dL   RDW 86.5 88.9 - 84.9 %   Platelets 274 140 - 400 Thousand/uL   MPV 10.0 7.5 - 12.5 fL   Neutro Abs 2,313 1,500 - 7,800 cells/uL   Lymphs Abs 1,557 850 - 3,900 cells/uL   Absolute Monocytes 351 200 - 950 cells/uL   Eosinophils Absolute 248 15 - 500 cells/uL   Basophils Absolute 32 0 - 200 cells/uL   Neutrophils Relative % 51.4 %   Total Lymphocyte 34.6 %   Monocytes Relative 7.8 %   Eosinophils Relative 5.5 %   Basophils Relative 0.7 %  COMPLETE METABOLIC PANEL WITH GFR   Collection Time: 10/16/22  8:25 AM  Result Value Ref Range   Glucose, Bld 109 (H) 65 - 99 mg/dL   BUN 10 7 - 25 mg/dL   Creat 9.02 9.39 - 8.70 mg/dL   eGFR 98 > OR = 60 fO/fpw/8.26f7   BUN/Creatinine Ratio SEE NOTE: 6 - 22 (calc)   Sodium 139 135 - 146 mmol/L   Potassium 4.1 3.5 - 5.3 mmol/L   Chloride 101 98 - 110 mmol/L   CO2 29 20 - 32 mmol/L   Calcium  9.2 8.6 - 10.3 mg/dL   Total Protein 7.0 6.1 - 8.1 g/dL   Albumin 3.9 3.6 - 5.1 g/dL   Globulin 3.1 1.9 - 3.7 g/dL (calc)   AG Ratio 1.3 1.0 - 2.5 (calc)   Total Bilirubin 0.5 0.2 - 1.2 mg/dL   Alkaline phosphatase (APISO) 93 36 - 130 U/L   AST 16 10 - 40 U/L   ALT 9 9 - 46 U/L  Lipid panel   Collection Time: 10/16/22  8:25 AM  Result Value Ref Range   Cholesterol 158 <200 mg/dL   HDL 50 > OR = 40 mg/dL   Triglycerides 90 <849 mg/dL   LDL Cholesterol (Calc) 90 mg/dL (calc)   Total CHOL/HDL Ratio 3.2 <5.0 (calc)   Non-HDL Cholesterol (Calc) 108 <130 mg/dL (calc)  Hemoglobin J8r   Collection Time: 10/16/22  8:25 AM  Result Value Ref  Range   Hgb A1c MFr Bld 5.8 (H) <5.7 % of total Hgb   Mean Plasma Glucose 120 mg/dL   eAG (mmol/L) 6.6 mmol/L          Assessment & Plan:   Problem List Items Addressed This Visit       Cardiovascular  and Mediastinum   Essential hypertension, benign - Primary (Chronic)   Relevant Medications   metoprolol  succinate (TOPROL -XL) 25 MG 24 hr tablet   losartan -hydrochlorothiazide  (HYZAAR) 100-12.5 MG tablet   atorvastatin  (LIPITOR) 20 MG tablet   Other Relevant Orders   CBC with Differential/Platelet   Comprehensive metabolic panel with GFR     Respiratory   Mild intermittent asthma without complication   OSA (obstructive sleep apnea)     Other   Class 3 severe obesity with body mass index (BMI) of 40.0 to 44.9 in adult [facility])   Hyperlipidemia   Relevant Medications   metoprolol  succinate (TOPROL -XL) 25 MG 24 hr tablet   losartan -hydrochlorothiazide  (HYZAAR) 100-12.5 MG tablet   atorvastatin  (LIPITOR) 20 MG tablet   Other Relevant Orders   Lipid panel   Headache disorder   Relevant Medications   metoprolol  succinate (TOPROL -XL) 25 MG 24 hr tablet   Ubrogepant  50 MG TABS   Other Visit Diagnoses       Screening for diabetes mellitus       Relevant Orders   Comprehensive metabolic panel with GFR   Hemoglobin A1c     Screening for colon cancer       Relevant Orders   Cologuard        Assessment and Plan Assessment & Plan Essential hypertension Hypertension is managed with losartan -hydrochlorothiazide  and metoprolol . Blood pressure control is crucial to prevent complications. - Continue losartan -hydrochlorothiazide  100-12.5 mg daily - Continue metoprolol  25 mg daily  Class 3 obesity Contributing to overall health issues, including potential erectile dysfunction. Weight management is important for overall health improvement. - Encourage continuation of lifestyle modifications, including dietary management and regular exercise. -continue to increase physical  activity, getting at least 150 min of physical activity a week.  Work on including runner, broadcasting/film/video 2 days a week.  - continue eating at a calorie deficit 2000-2200 cal a day, eating a well balanced diet with whole foods, avoiding processed foods.   Patient is motivated to continue working on lifestyle modification.    Hyperlipidemia Managed with atorvastatin . Last lipid panel in July 2024 was within normal range. - Continue atorvastatin  20 mg daily - Ordered blood work to monitor lipid levels  Asthma Managed with albuterol  inhaler as needed. - Continue albuterol  inhaler as needed  Obstructive sleep apnea -stable  General Health Maintenance Overdue for colorectal cancer screening. Discussed options including colonoscopy and Cologuard. Cologuard chosen for engineer, water. - Ordered Cologuard test for colorectal cancer screening - Ordered blood work        Follow up plan: Return in about 6 months (around 09/28/2024) for follow up. "

## 2024-04-01 ENCOUNTER — Other Ambulatory Visit (HOSPITAL_COMMUNITY): Payer: Self-pay

## 2024-04-01 ENCOUNTER — Telehealth: Payer: Self-pay | Admitting: Pharmacy Technician

## 2024-04-01 ENCOUNTER — Ambulatory Visit: Payer: Self-pay | Admitting: Nurse Practitioner

## 2024-04-01 LAB — COMPREHENSIVE METABOLIC PANEL WITH GFR
AG Ratio: 1.3 (calc) (ref 1.0–2.5)
ALT: 11 U/L (ref 9–46)
AST: 19 U/L (ref 10–40)
Albumin: 4.4 g/dL (ref 3.6–5.1)
Alkaline phosphatase (APISO): 113 U/L (ref 36–130)
BUN: 10 mg/dL (ref 7–25)
CO2: 32 mmol/L (ref 20–32)
Calcium: 9.4 mg/dL (ref 8.6–10.3)
Chloride: 98 mmol/L (ref 98–110)
Creat: 0.87 mg/dL (ref 0.60–1.29)
Globulin: 3.4 g/dL (ref 1.9–3.7)
Glucose, Bld: 81 mg/dL (ref 65–99)
Potassium: 4.4 mmol/L (ref 3.5–5.3)
Sodium: 138 mmol/L (ref 135–146)
Total Bilirubin: 0.8 mg/dL (ref 0.2–1.2)
Total Protein: 7.8 g/dL (ref 6.1–8.1)
eGFR: 108 mL/min/1.73m2

## 2024-04-01 LAB — CBC WITH DIFFERENTIAL/PLATELET
Absolute Lymphocytes: 2515 {cells}/uL (ref 850–3900)
Absolute Monocytes: 667 {cells}/uL (ref 200–950)
Basophils Absolute: 20 {cells}/uL (ref 0–200)
Basophils Relative: 0.3 %
Eosinophils Absolute: 211 {cells}/uL (ref 15–500)
Eosinophils Relative: 3.2 %
HCT: 46.1 % (ref 39.4–51.1)
Hemoglobin: 14.8 g/dL (ref 13.2–17.1)
MCH: 27.5 pg (ref 27.0–33.0)
MCHC: 32.1 g/dL (ref 31.6–35.4)
MCV: 85.5 fL (ref 81.4–101.7)
MPV: 10.2 fL (ref 7.5–12.5)
Monocytes Relative: 10.1 %
Neutro Abs: 3188 {cells}/uL (ref 1500–7800)
Neutrophils Relative %: 48.3 %
Platelets: 303 Thousand/uL (ref 140–400)
RBC: 5.39 Million/uL (ref 4.20–5.80)
RDW: 13 % (ref 11.0–15.0)
Total Lymphocyte: 38.1 %
WBC: 6.6 Thousand/uL (ref 3.8–10.8)

## 2024-04-01 LAB — LIPID PANEL
Cholesterol: 175 mg/dL
HDL: 53 mg/dL
LDL Cholesterol (Calc): 103 mg/dL — ABNORMAL HIGH
Non-HDL Cholesterol (Calc): 122 mg/dL
Total CHOL/HDL Ratio: 3.3 (calc)
Triglycerides: 97 mg/dL

## 2024-04-01 LAB — HEMOGLOBIN A1C
Hgb A1c MFr Bld: 5.8 % — ABNORMAL HIGH
Mean Plasma Glucose: 120 mg/dL
eAG (mmol/L): 6.6 mmol/L

## 2024-04-01 NOTE — Telephone Encounter (Signed)
 Pharmacy Patient Advocate Encounter  Received notification from [facility] that Prior Authorization for Ubrelvy  50MG  tablets has been APPROVED from 04/01/24 to 04/01/25. Ran test claim, Copay is $0.00. This test claim was processed through [facility]- copay amounts may vary at other pharmacies due to pharmacy/plan contracts, or as the patient moves through the different stages of their insurance plan. The PA is good for 16 tablets per month, not 30 tablets per month.    PA #/Case ID/Reference #: [id number]

## 2024-04-01 NOTE — Telephone Encounter (Signed)
 Pharmacy Patient Advocate Encounter   Received notification from [facility] KEY that prior authorization for Ubrelvy  50MG  tablets is required/requested.   Insurance verification completed.   The patient is insured through [facility].   Per test claim: PA required; PA started via CoverMyMeds. KEY [id number] . Waiting for clinical questions to populate.

## 2024-09-29 ENCOUNTER — Ambulatory Visit: Admitting: Nurse Practitioner
